# Patient Record
Sex: Female | Born: 1980
Health system: Southern US, Community
[De-identification: ages and names within clinical notes are randomized; demographics above are authoritative.]

## PROBLEM LIST (undated history)

## (undated) DIAGNOSIS — F319 Bipolar disorder, unspecified: Secondary | ICD-10-CM

## (undated) DIAGNOSIS — F32A Depression, unspecified: Secondary | ICD-10-CM

## (undated) DIAGNOSIS — F329 Major depressive disorder, single episode, unspecified: Secondary | ICD-10-CM

## (undated) DIAGNOSIS — Z789 Other specified health status: Secondary | ICD-10-CM

## (undated) HISTORY — PX: NO PAST SURGERIES: SHX2092

---
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
## 1999-04-04 ENCOUNTER — Encounter: Payer: Self-pay | Admitting: Emergency Medicine

## 1999-04-04 ENCOUNTER — Emergency Department (HOSPITAL_COMMUNITY): Admission: EM | Admit: 1999-04-04 | Discharge: 1999-04-04 | Payer: Self-pay | Admitting: Emergency Medicine

## 2000-05-18 ENCOUNTER — Emergency Department (HOSPITAL_COMMUNITY): Admission: EM | Admit: 2000-05-18 | Discharge: 2000-05-18 | Payer: Self-pay | Admitting: *Deleted

## 2000-05-23 ENCOUNTER — Emergency Department (HOSPITAL_COMMUNITY): Admission: EM | Admit: 2000-05-23 | Discharge: 2000-05-23 | Payer: Self-pay | Admitting: Emergency Medicine

## 2000-05-25 ENCOUNTER — Emergency Department (HOSPITAL_COMMUNITY): Admission: EM | Admit: 2000-05-25 | Discharge: 2000-05-25 | Payer: Self-pay

## 2001-01-22 ENCOUNTER — Emergency Department (HOSPITAL_COMMUNITY): Admission: EM | Admit: 2001-01-22 | Discharge: 2001-01-22 | Payer: Self-pay | Admitting: Internal Medicine

## 2001-02-01 ENCOUNTER — Emergency Department (HOSPITAL_COMMUNITY): Admission: EM | Admit: 2001-02-01 | Discharge: 2001-02-01 | Payer: Self-pay | Admitting: Internal Medicine

## 2001-06-12 ENCOUNTER — Emergency Department (HOSPITAL_COMMUNITY): Admission: EM | Admit: 2001-06-12 | Discharge: 2001-06-12 | Payer: Self-pay | Admitting: Emergency Medicine

## 2001-08-18 ENCOUNTER — Emergency Department (HOSPITAL_COMMUNITY): Admission: EM | Admit: 2001-08-18 | Discharge: 2001-08-18 | Payer: Self-pay

## 2002-01-01 ENCOUNTER — Observation Stay (HOSPITAL_COMMUNITY): Admission: AD | Admit: 2002-01-01 | Discharge: 2002-01-01 | Payer: Self-pay | Admitting: Obstetrics and Gynecology

## 2002-01-24 ENCOUNTER — Inpatient Hospital Stay (HOSPITAL_COMMUNITY): Admission: AD | Admit: 2002-01-24 | Discharge: 2002-01-24 | Payer: Self-pay | Admitting: *Deleted

## 2002-01-30 ENCOUNTER — Encounter: Admission: RE | Admit: 2002-01-30 | Discharge: 2002-01-30 | Payer: Self-pay | Admitting: Psychiatry

## 2002-02-02 ENCOUNTER — Ambulatory Visit (HOSPITAL_COMMUNITY): Admission: RE | Admit: 2002-02-02 | Discharge: 2002-02-02 | Payer: Self-pay | Admitting: *Deleted

## 2002-02-16 ENCOUNTER — Encounter: Admission: RE | Admit: 2002-02-16 | Discharge: 2002-02-16 | Payer: Self-pay | Admitting: *Deleted

## 2002-03-19 ENCOUNTER — Ambulatory Visit (HOSPITAL_COMMUNITY): Admission: RE | Admit: 2002-03-19 | Discharge: 2002-03-19 | Payer: Self-pay | Admitting: *Deleted

## 2002-03-31 ENCOUNTER — Inpatient Hospital Stay (HOSPITAL_COMMUNITY): Admission: AD | Admit: 2002-03-31 | Discharge: 2002-03-31 | Payer: Self-pay | Admitting: *Deleted

## 2002-04-09 ENCOUNTER — Ambulatory Visit (HOSPITAL_COMMUNITY): Admission: RE | Admit: 2002-04-09 | Discharge: 2002-04-09 | Payer: Self-pay | Admitting: *Deleted

## 2002-06-08 ENCOUNTER — Inpatient Hospital Stay (HOSPITAL_COMMUNITY): Admission: AD | Admit: 2002-06-08 | Discharge: 2002-06-08 | Payer: Self-pay | Admitting: Obstetrics and Gynecology

## 2002-06-25 ENCOUNTER — Inpatient Hospital Stay (HOSPITAL_COMMUNITY): Admission: AD | Admit: 2002-06-25 | Discharge: 2002-06-25 | Payer: Self-pay | Admitting: *Deleted

## 2002-06-27 ENCOUNTER — Encounter (HOSPITAL_COMMUNITY): Admission: RE | Admit: 2002-06-27 | Discharge: 2002-07-27 | Payer: Self-pay | Admitting: *Deleted

## 2002-06-27 ENCOUNTER — Encounter: Admission: RE | Admit: 2002-06-27 | Discharge: 2002-06-27 | Payer: Self-pay | Admitting: *Deleted

## 2002-07-04 ENCOUNTER — Encounter: Admission: RE | Admit: 2002-07-04 | Discharge: 2002-07-04 | Payer: Self-pay | Admitting: *Deleted

## 2002-07-11 ENCOUNTER — Encounter: Admission: RE | Admit: 2002-07-11 | Discharge: 2002-07-11 | Payer: Self-pay | Admitting: *Deleted

## 2002-07-19 ENCOUNTER — Encounter: Admission: RE | Admit: 2002-07-19 | Discharge: 2002-07-19 | Payer: Self-pay | Admitting: *Deleted

## 2002-07-26 ENCOUNTER — Encounter: Admission: RE | Admit: 2002-07-26 | Discharge: 2002-07-26 | Payer: Self-pay | Admitting: *Deleted

## 2002-07-30 ENCOUNTER — Encounter (HOSPITAL_COMMUNITY): Admission: RE | Admit: 2002-07-30 | Discharge: 2002-08-06 | Payer: Self-pay | Admitting: *Deleted

## 2002-08-02 ENCOUNTER — Encounter: Admission: RE | Admit: 2002-08-02 | Discharge: 2002-08-02 | Payer: Self-pay | Admitting: *Deleted

## 2002-08-09 ENCOUNTER — Encounter: Admission: RE | Admit: 2002-08-09 | Discharge: 2002-08-09 | Payer: Self-pay | Admitting: *Deleted

## 2002-08-09 ENCOUNTER — Inpatient Hospital Stay (HOSPITAL_COMMUNITY): Admission: AD | Admit: 2002-08-09 | Discharge: 2002-08-12 | Payer: Self-pay | Admitting: *Deleted

## 2002-09-18 ENCOUNTER — Emergency Department (HOSPITAL_COMMUNITY): Admission: EM | Admit: 2002-09-18 | Discharge: 2002-09-18 | Payer: Self-pay | Admitting: Emergency Medicine

## 2002-10-03 ENCOUNTER — Encounter (HOSPITAL_BASED_OUTPATIENT_CLINIC_OR_DEPARTMENT_OTHER): Payer: Self-pay | Admitting: General Surgery

## 2002-10-03 ENCOUNTER — Ambulatory Visit (HOSPITAL_COMMUNITY): Admission: RE | Admit: 2002-10-03 | Discharge: 2002-10-03 | Payer: Self-pay | Admitting: General Surgery

## 2002-12-18 ENCOUNTER — Encounter: Admission: RE | Admit: 2002-12-18 | Discharge: 2002-12-18 | Payer: Self-pay | Admitting: Sports Medicine

## 2003-01-29 ENCOUNTER — Encounter: Admission: RE | Admit: 2003-01-29 | Discharge: 2003-01-29 | Payer: Self-pay | Admitting: Sports Medicine

## 2003-04-22 ENCOUNTER — Encounter: Admission: RE | Admit: 2003-04-22 | Discharge: 2003-04-22 | Payer: Self-pay | Admitting: Sports Medicine

## 2003-04-22 ENCOUNTER — Other Ambulatory Visit: Admission: RE | Admit: 2003-04-22 | Discharge: 2003-04-22 | Payer: Self-pay | Admitting: Family Medicine

## 2003-04-30 ENCOUNTER — Encounter: Admission: RE | Admit: 2003-04-30 | Discharge: 2003-04-30 | Payer: Self-pay | Admitting: Sports Medicine

## 2003-04-30 ENCOUNTER — Encounter: Payer: Self-pay | Admitting: Sports Medicine

## 2003-04-30 ENCOUNTER — Encounter: Admission: RE | Admit: 2003-04-30 | Discharge: 2003-04-30 | Payer: Self-pay | Admitting: Family Medicine

## 2003-05-01 ENCOUNTER — Encounter: Admission: RE | Admit: 2003-05-01 | Discharge: 2003-05-01 | Payer: Self-pay | Admitting: Family Medicine

## 2003-05-05 ENCOUNTER — Emergency Department (HOSPITAL_COMMUNITY): Admission: EM | Admit: 2003-05-05 | Discharge: 2003-05-05 | Payer: Self-pay | Admitting: *Deleted

## 2003-05-06 ENCOUNTER — Encounter: Payer: Self-pay | Admitting: Emergency Medicine

## 2003-05-06 ENCOUNTER — Emergency Department (HOSPITAL_COMMUNITY): Admission: EM | Admit: 2003-05-06 | Discharge: 2003-05-06 | Payer: Self-pay | Admitting: *Deleted

## 2003-05-08 ENCOUNTER — Encounter: Admission: RE | Admit: 2003-05-08 | Discharge: 2003-05-08 | Payer: Self-pay | Admitting: Family Medicine

## 2003-05-13 ENCOUNTER — Encounter: Admission: RE | Admit: 2003-05-13 | Discharge: 2003-05-13 | Payer: Self-pay | Admitting: Sports Medicine

## 2003-05-13 ENCOUNTER — Encounter: Payer: Self-pay | Admitting: Sports Medicine

## 2003-05-15 ENCOUNTER — Encounter: Admission: RE | Admit: 2003-05-15 | Discharge: 2003-05-15 | Payer: Self-pay | Admitting: Family Medicine

## 2003-05-21 ENCOUNTER — Encounter: Admission: RE | Admit: 2003-05-21 | Discharge: 2003-05-21 | Payer: Self-pay | Admitting: Family Medicine

## 2003-06-04 ENCOUNTER — Encounter: Admission: RE | Admit: 2003-06-04 | Discharge: 2003-06-04 | Payer: Self-pay | Admitting: Family Medicine

## 2003-08-09 ENCOUNTER — Emergency Department (HOSPITAL_COMMUNITY): Admission: EM | Admit: 2003-08-09 | Discharge: 2003-08-09 | Payer: Self-pay | Admitting: Emergency Medicine

## 2004-01-07 ENCOUNTER — Emergency Department (HOSPITAL_COMMUNITY): Admission: EM | Admit: 2004-01-07 | Discharge: 2004-01-07 | Payer: Self-pay | Admitting: Emergency Medicine

## 2004-01-15 ENCOUNTER — Emergency Department (HOSPITAL_COMMUNITY): Admission: EM | Admit: 2004-01-15 | Discharge: 2004-01-15 | Payer: Self-pay | Admitting: Emergency Medicine

## 2004-01-16 ENCOUNTER — Encounter: Admission: RE | Admit: 2004-01-16 | Discharge: 2004-01-16 | Payer: Self-pay | Admitting: Family Medicine

## 2004-01-16 ENCOUNTER — Other Ambulatory Visit: Admission: RE | Admit: 2004-01-16 | Discharge: 2004-01-16 | Payer: Self-pay | Admitting: Family Medicine

## 2004-07-23 ENCOUNTER — Emergency Department (HOSPITAL_COMMUNITY): Admission: EM | Admit: 2004-07-23 | Discharge: 2004-07-23 | Payer: Self-pay | Admitting: Emergency Medicine

## 2004-10-15 ENCOUNTER — Ambulatory Visit: Payer: Self-pay | Admitting: Sports Medicine

## 2005-08-10 IMAGING — CT CT PELVIS W/O CM
1 series · 16 of 32 positions shown, 20 images · non-contrast
Comparison: none

CLINICAL DATA: Abdominal and pelvic pain.  Right flank pain. 
 CT ABDOMEN AND PELVIS WITHOUT CONTRAST
TECHNIQUE: Multidetector helical CT scanning obtained through the abdomen and pelvis.

[Series 2: abd pelvis · axial · 0.73mm/px · z∈[-389,-24]mm · 16 of 82 slices shown, 20 images]
[im 6/82  soft-tissue]
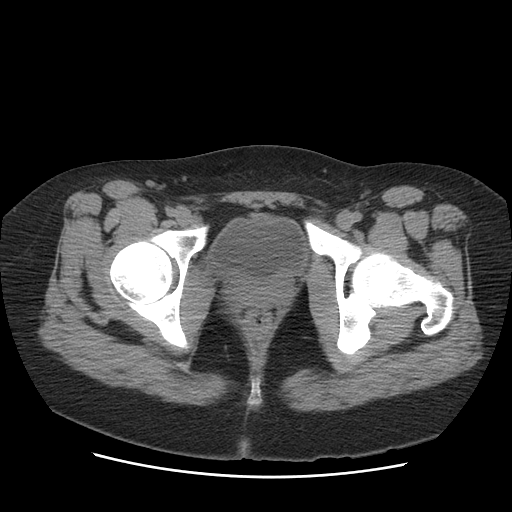
[im 6/82  bone]
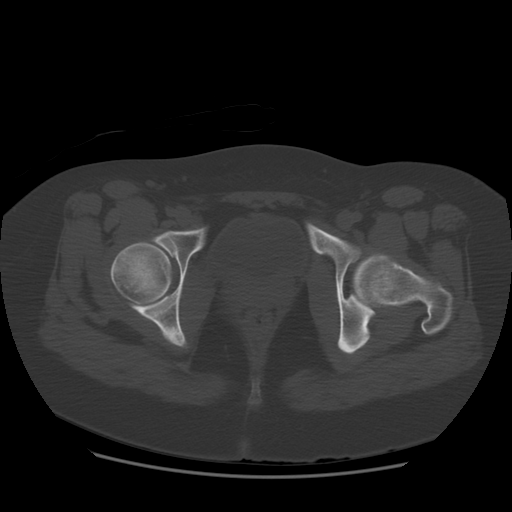
[im 11/82  soft-tissue]
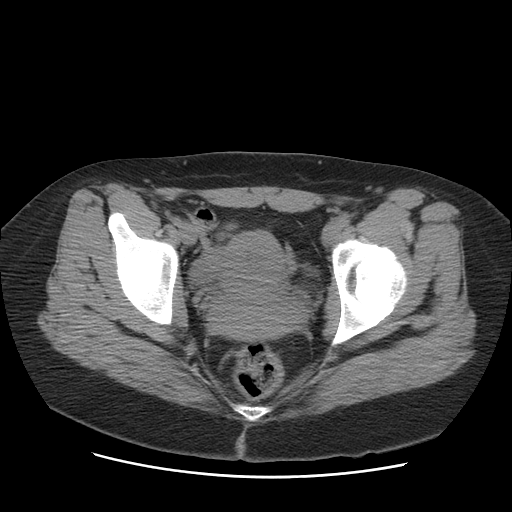
[im 16/82  soft-tissue]
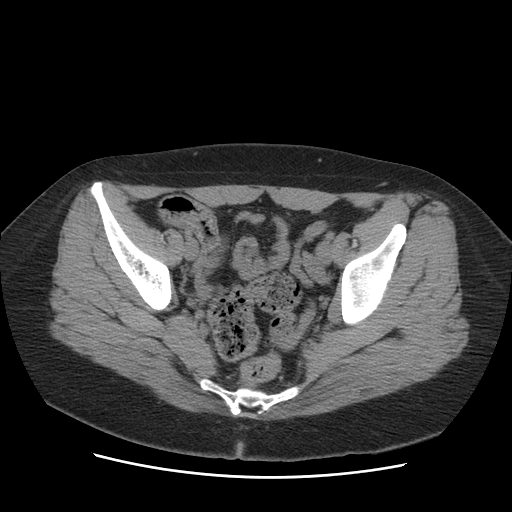
[im 21/82  soft-tissue]
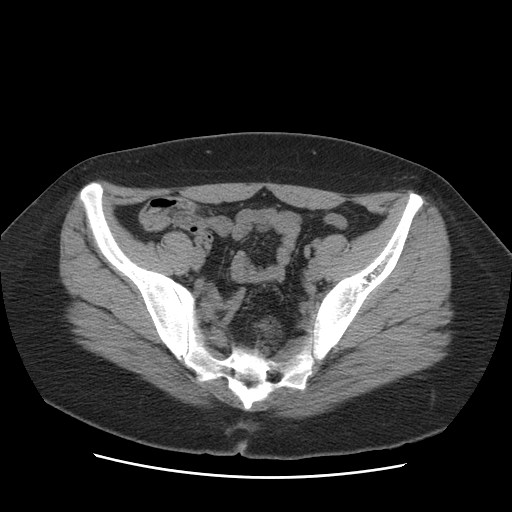
[im 27/82  soft-tissue]
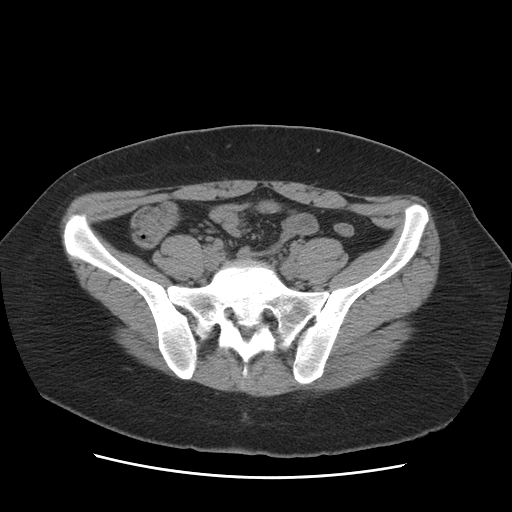
[im 32/82  soft-tissue]
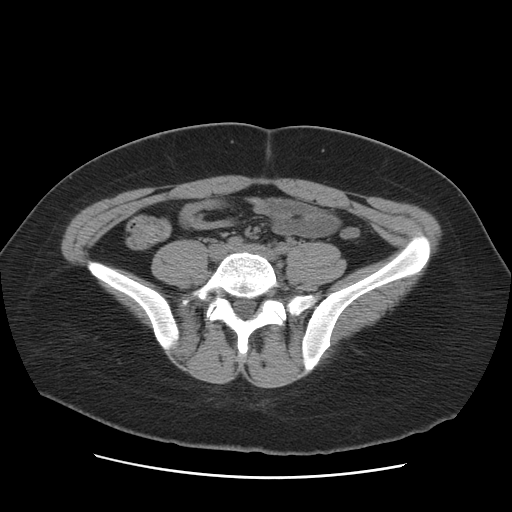
[im 37/82  soft-tissue]
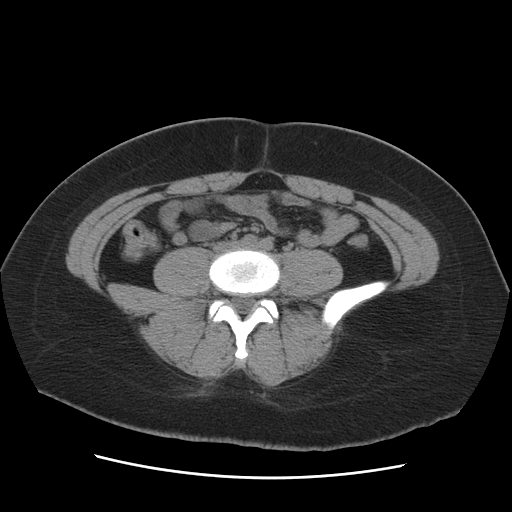
[im 45/82  soft-tissue]
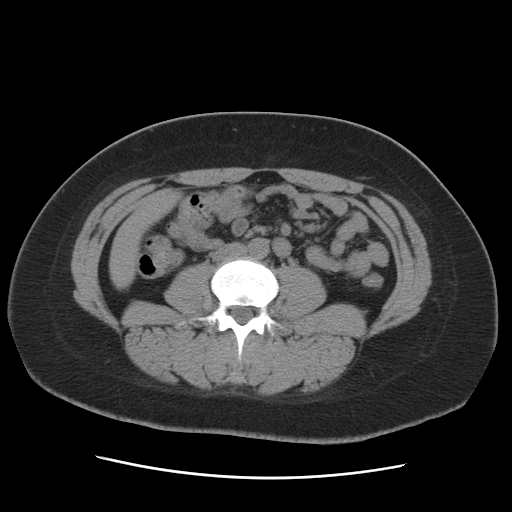
[im 50/82  soft-tissue]
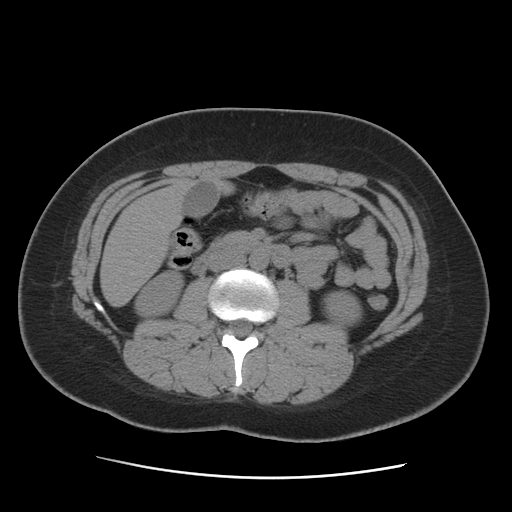
[im 50/82  bone]
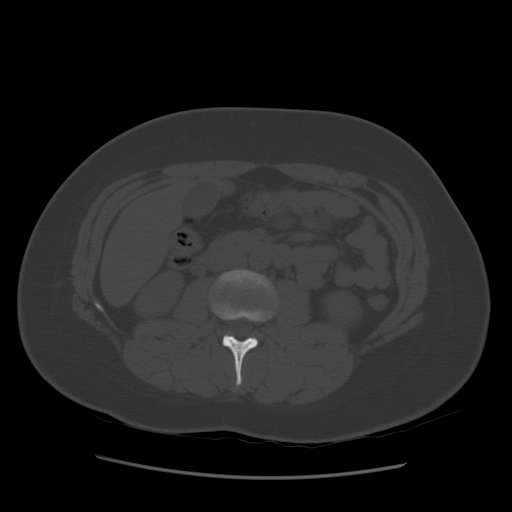
[im 55/82  soft-tissue]
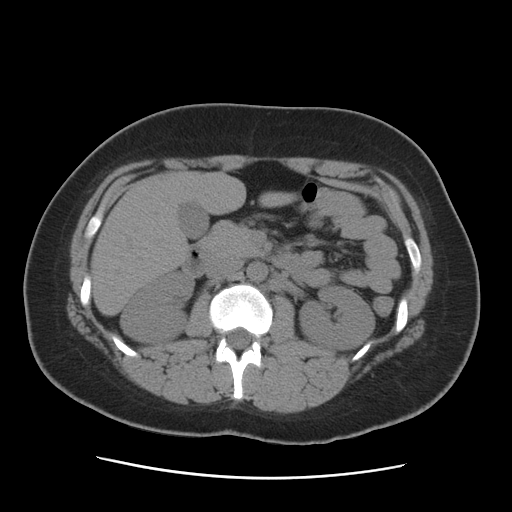
[im 61/82  soft-tissue]
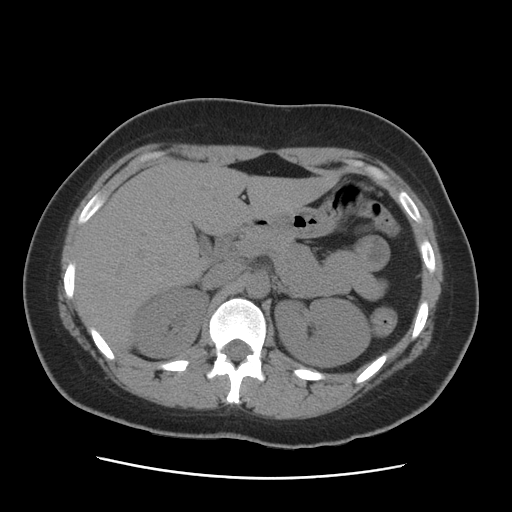
[im 66/82  soft-tissue]
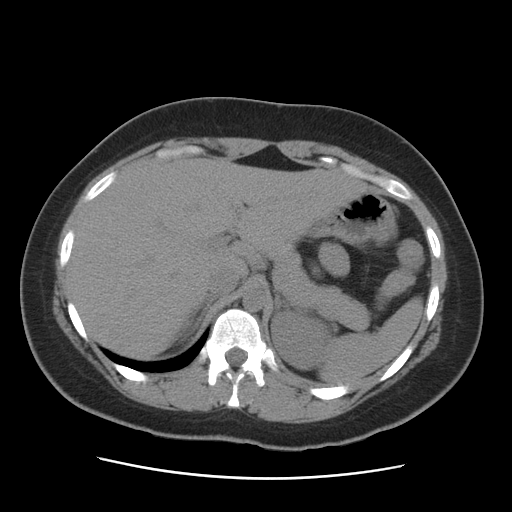
[im 71/82  soft-tissue]
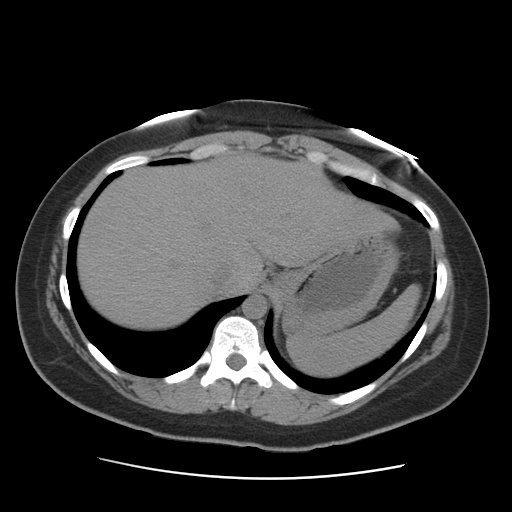
[im 71/82  lung]
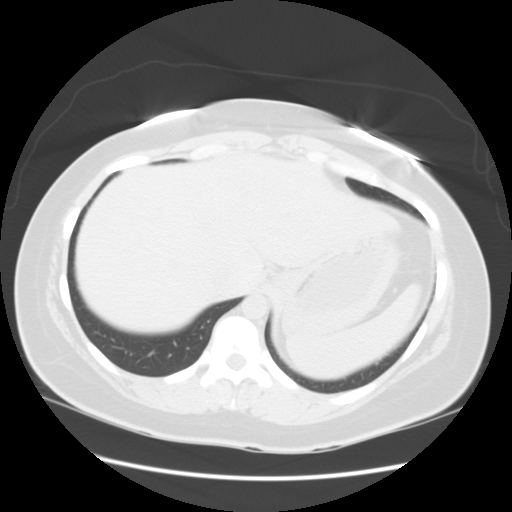
[im 74/82  lung]
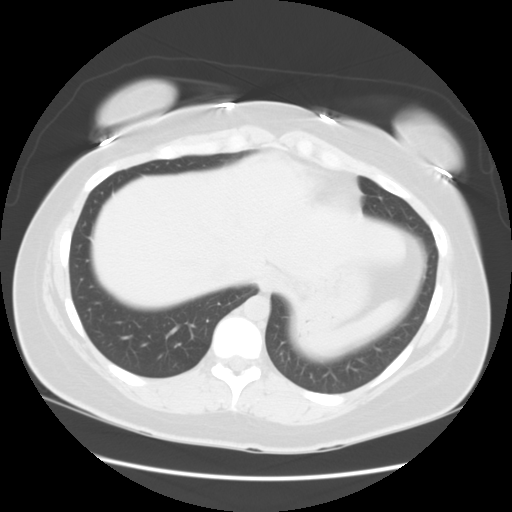
[im 76/82  soft-tissue]
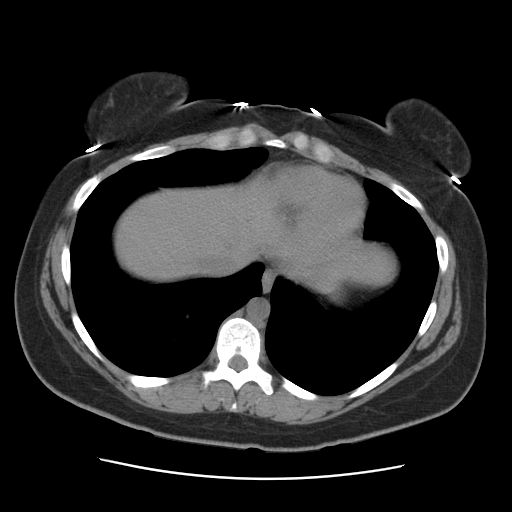
[im 76/82  lung]
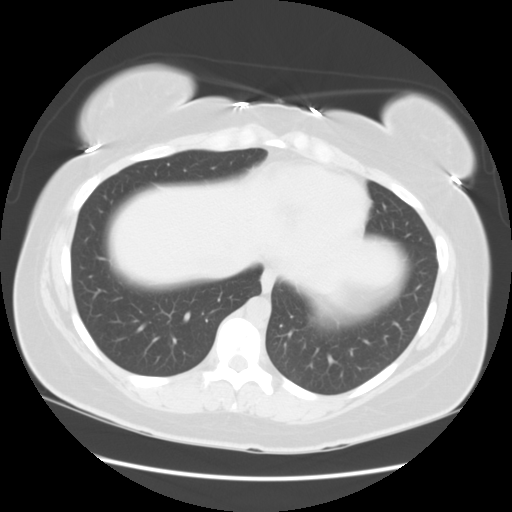
[im 79/82  lung]
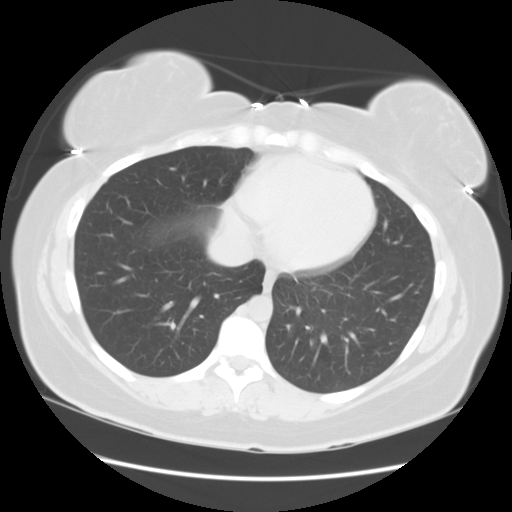

[16 of 32 positions shown; findings below may reference images not displayed]

FINDINGS: No comparison films available. 
 CT ABDOMEN 
 The liver, gallbladder, spleen, pancreas, adrenal glands, and kidneys are unremarkable.  Please note that parenchymal lesions may be missed within the solid viscera as intravenous contrast was not administered.  No evidence of enlarged lymph nodes, free fluid, or abdominal aortic aneurysm.  Visualized bowel is grossly unremarkable. 
 IMPRESSION
 No acute abnormality. 
 CT PELVIS 
 The bladder, bowel and appendix are unremarkable.  No free fluid, enlarged lymph nodes or urinary calculi.  
 IMPRESSION
 No acute abnormality.

## 2005-10-23 ENCOUNTER — Emergency Department (HOSPITAL_COMMUNITY): Admission: EM | Admit: 2005-10-23 | Discharge: 2005-10-23 | Payer: Self-pay | Admitting: Emergency Medicine

## 2005-10-26 ENCOUNTER — Inpatient Hospital Stay (HOSPITAL_COMMUNITY): Admission: AD | Admit: 2005-10-26 | Discharge: 2005-10-26 | Payer: Self-pay | Admitting: Family Medicine

## 2006-02-18 ENCOUNTER — Encounter (INDEPENDENT_AMBULATORY_CARE_PROVIDER_SITE_OTHER): Payer: Self-pay | Admitting: *Deleted

## 2006-03-11 ENCOUNTER — Ambulatory Visit: Payer: Self-pay | Admitting: Family Medicine

## 2006-03-11 ENCOUNTER — Other Ambulatory Visit: Admission: RE | Admit: 2006-03-11 | Discharge: 2006-03-11 | Payer: Self-pay | Admitting: Family Medicine

## 2006-03-17 ENCOUNTER — Ambulatory Visit: Payer: Self-pay | Admitting: Family Medicine

## 2006-04-05 ENCOUNTER — Ambulatory Visit: Payer: Self-pay | Admitting: Family Medicine

## 2006-11-17 DIAGNOSIS — F319 Bipolar disorder, unspecified: Secondary | ICD-10-CM | POA: Insufficient documentation

## 2006-11-17 DIAGNOSIS — E282 Polycystic ovarian syndrome: Secondary | ICD-10-CM

## 2006-11-17 DIAGNOSIS — E669 Obesity, unspecified: Secondary | ICD-10-CM

## 2006-11-17 DIAGNOSIS — J45909 Unspecified asthma, uncomplicated: Secondary | ICD-10-CM | POA: Insufficient documentation

## 2006-11-17 DIAGNOSIS — F17200 Nicotine dependence, unspecified, uncomplicated: Secondary | ICD-10-CM

## 2006-11-17 DIAGNOSIS — L680 Hirsutism: Secondary | ICD-10-CM

## 2006-11-18 ENCOUNTER — Encounter (INDEPENDENT_AMBULATORY_CARE_PROVIDER_SITE_OTHER): Payer: Self-pay | Admitting: *Deleted

## 2007-05-19 IMAGING — US US OB COMP LESS 14 WK
1 series · 14 of 27 positions shown · non-contrast
Comparison: none

CLINICAL DATA: 24-year-old female with positive pregnancy test.  Pelvic pain.  Nausea and vomiting.  Estimated gestational age by LMP of 7 weeks 6 days.  
 OBSTETRICAL ULTRASOUND <14 WKS AND TRANSVAGINAL OB US:
TECHNIQUE: Both transabdominal and transvaginal ultrasound examinations were performed for complete evaluation of the gestation as well as the maternal uterus, adnexal regions, and pelvic cul-de-sac.

[Series 1: ob · 0.32mm/px · 14 of 27 slices shown]
[im 1/27]
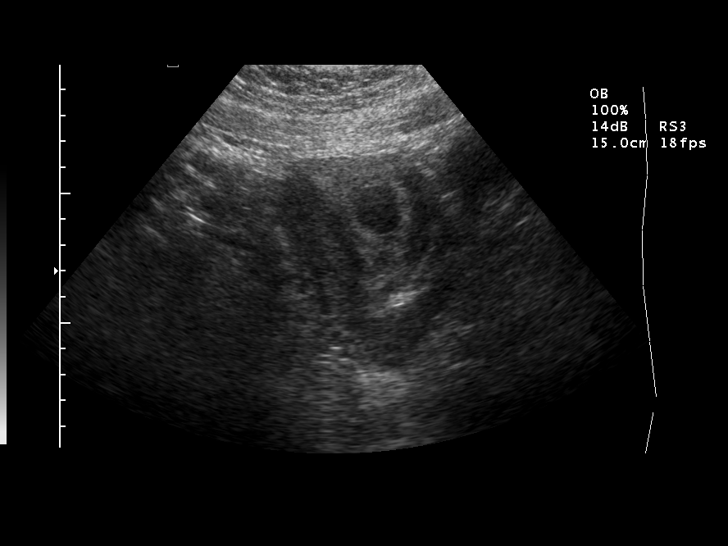
[im 3/27]
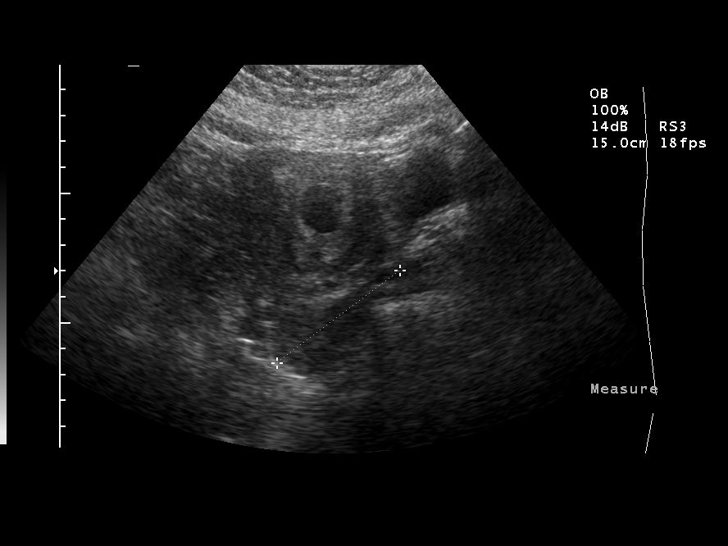
[im 5/27]
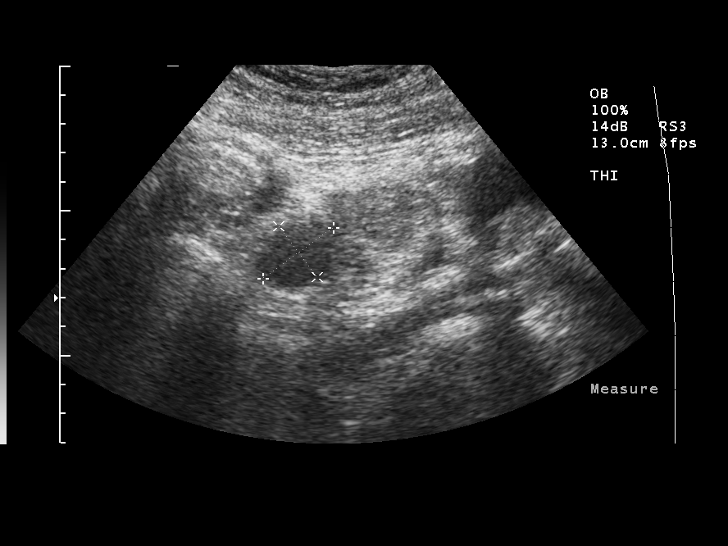
[im 7/27]
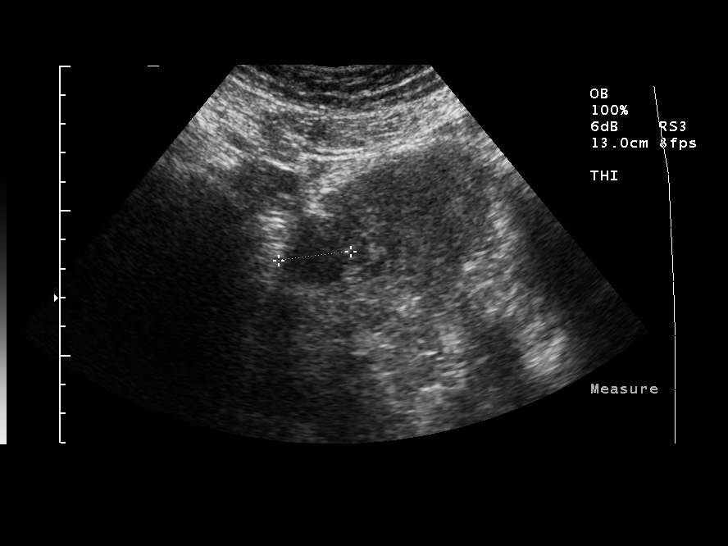
[im 9/27]
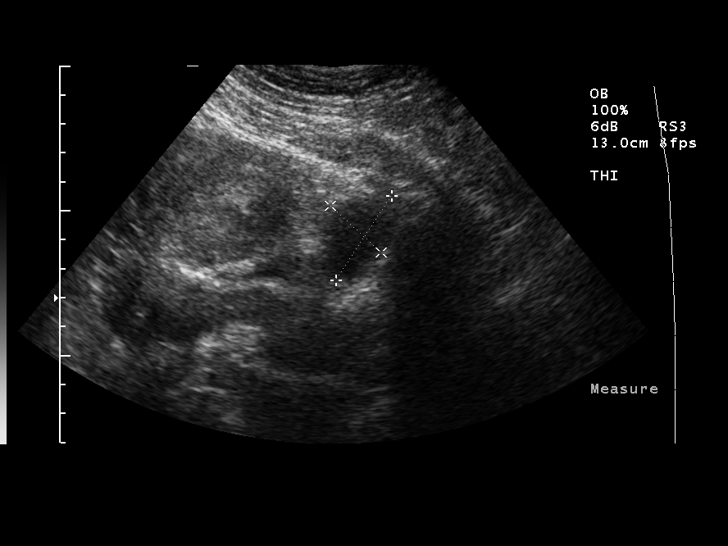
[im 11/27]
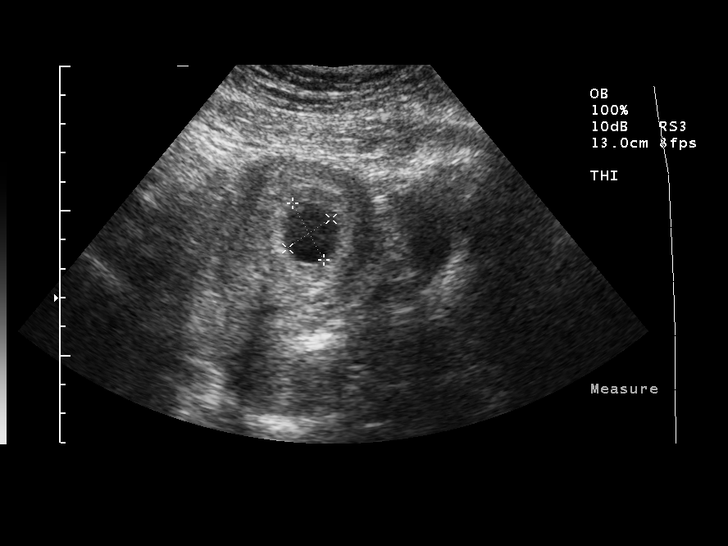
[im 13/27]
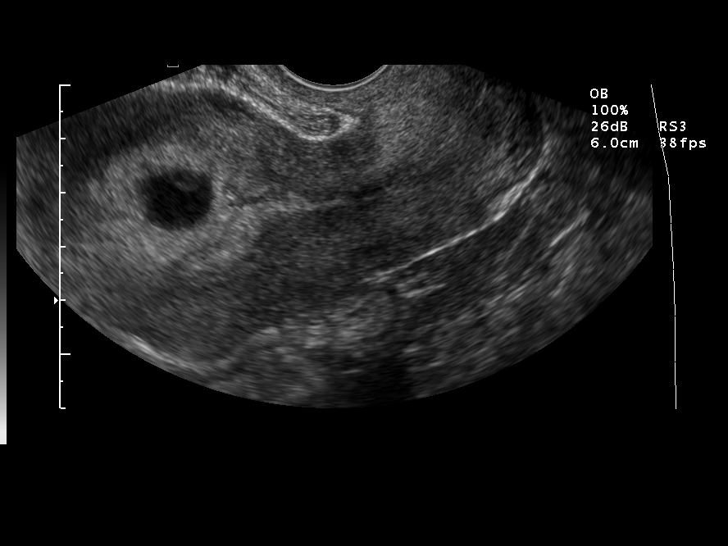
[im 15/27]
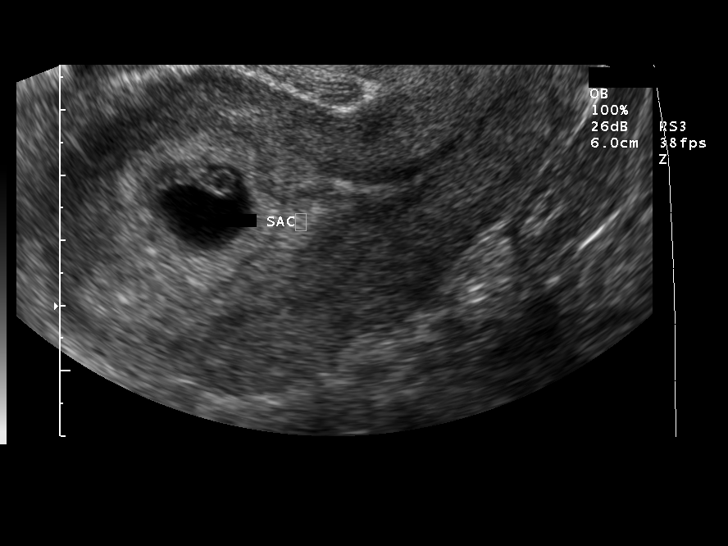
[im 17/27]
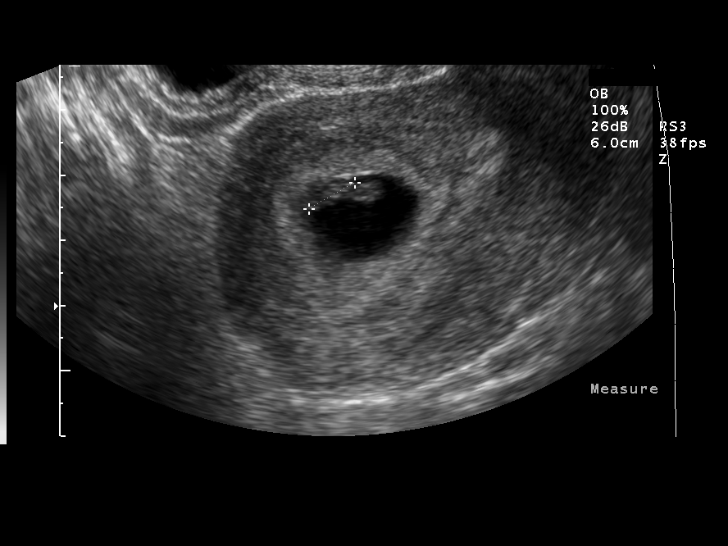
[im 19/27]
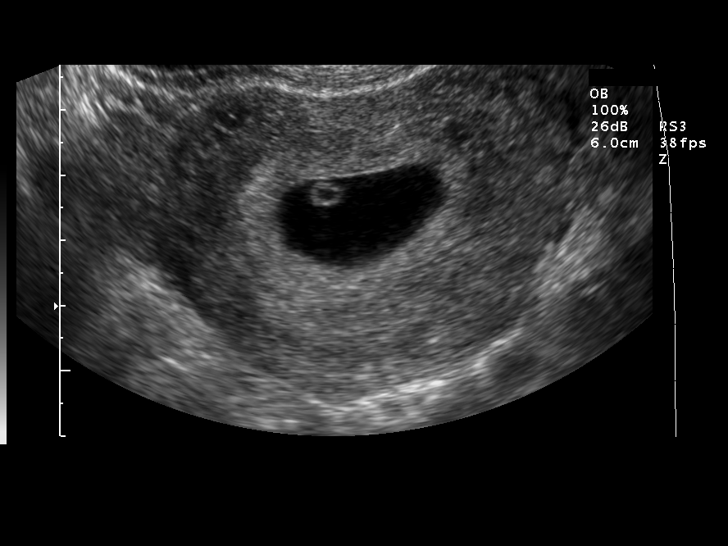
[im 21/27]
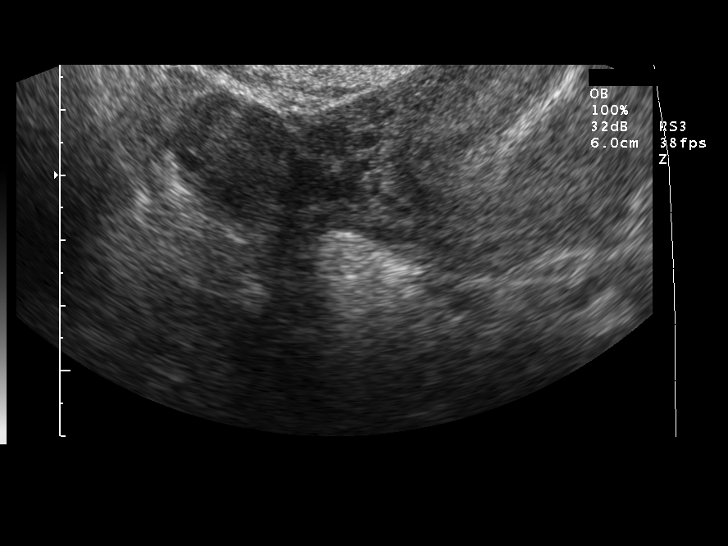
[im 23/27]
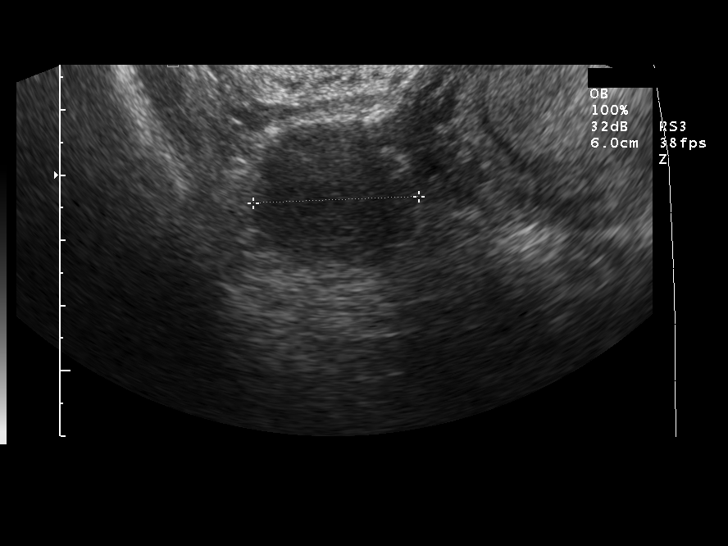
[im 25/27]
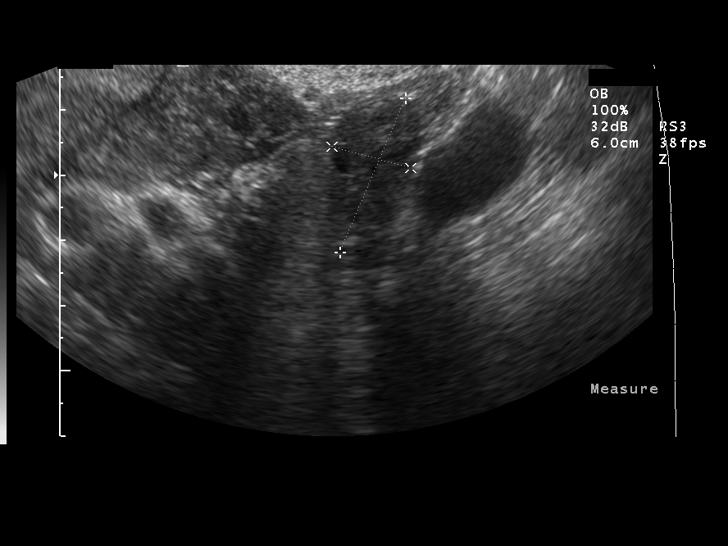
[im 27/27]
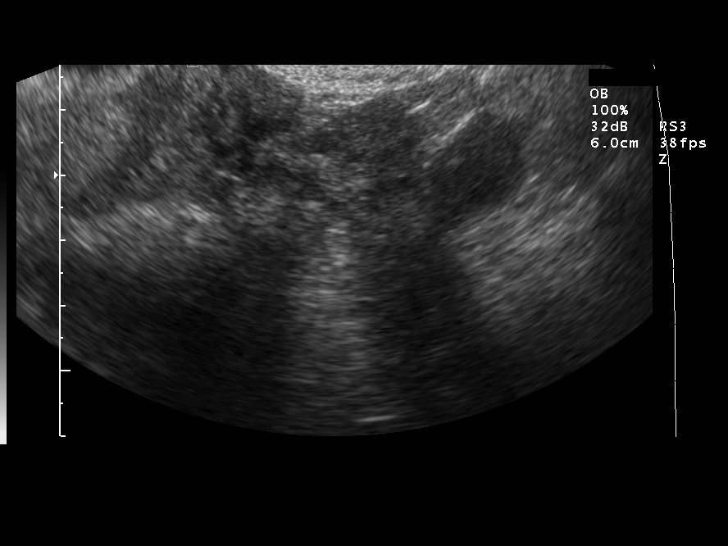

[14 of 27 positions shown; findings below may reference images not displayed]

FINDINGS: A single intrauterine gestational sac with yolk sac and fetal pole are identified.  Crown rump length measures 7.7 mm corresponding to a gestational age of 6 weeks 5 days.  Fetal heart rate measures 151 bpm.  There is no evidence of subchorionic hemorrhage.  
 The ovaries bilaterally are unremarkable.  No free fluid.
IMPRESSION: 1.  Single living intrauterine gestation with estimated gestational age of 6 weeks 5 days.  
 2.  No acute abnormality.

## 2015-10-13 ENCOUNTER — Encounter (HOSPITAL_COMMUNITY): Payer: Self-pay | Admitting: Emergency Medicine

## 2015-10-13 ENCOUNTER — Ambulatory Visit (HOSPITAL_COMMUNITY)
Admission: RE | Admit: 2015-10-13 | Discharge: 2015-10-13 | Disposition: A | Discharge status: Home / Self Care | Payer: PRIVATE HEALTH INSURANCE | Attending: Psychiatry | Admitting: Psychiatry

## 2015-10-13 DIAGNOSIS — F17210 Nicotine dependence, cigarettes, uncomplicated: Secondary | ICD-10-CM | POA: Diagnosis not present

## 2015-10-13 DIAGNOSIS — F331 Major depressive disorder, recurrent, moderate: Secondary | ICD-10-CM | POA: Diagnosis present

## 2015-10-13 DIAGNOSIS — F319 Bipolar disorder, unspecified: Secondary | ICD-10-CM | POA: Diagnosis not present

## 2015-10-13 HISTORY — DX: Bipolar disorder, unspecified: F31.9

## 2015-10-13 HISTORY — DX: Depression, unspecified: F32.A

## 2015-10-13 HISTORY — DX: Major depressive disorder, single episode, unspecified: F32.9

## 2015-10-13 HISTORY — DX: Other specified health status: Z78.9

## 2015-10-13 NOTE — BH Assessment (Addendum)
Assessment Note  Virginia Ballard is an 35 y.o. female who voluntarily presented to [facility] as a walk-in with c/o depression. Pt was very tearful throughout the assessment. Pt reported having a hx of bipolar and depression and being prescribed medications, at one point, to deal with her symptoms. Pt shared that she hasn't taken meds in @ 2 years. Pt indicated that, for the past 2 weeks, she's noticed that it's been increasingly difficult to properly function at work, due to her changing emotions and inability to focus, due to racing thoughts. Pt denied SI/HI/AVH, but indicated that she wanted to get back on her medications, along with therapy, so that she could get back on track at work before her symptoms worsen. Pt denied drug/alcohol abuse. Pt was amenable to trying Intensive Outpatient and an appt was scheduled.   Diagnosis: Major Depressive Disorder, recurrent episode, moderate, provisional  Past Medical History:  Past Medical History  Diagnosis Date  . Medical history non-contributory   . Bipolar disorder (HCC)     diagnosed >2 yrs ago  . Depression     diagnosed >2 yrs ago    Past Surgical History  Procedure Laterality Date  . No past surgeries      Family History: History reviewed. No pertinent family history.  Social History:  reports that she has been smoking Cigarettes.  She has been smoking about 0.50 packs per day. She does not have any smokeless tobacco history on file. She reports that she does not drink alcohol or use illicit drugs.  Additional Social History:  Alcohol / Drug Use Pain Medications: pt denies Prescriptions: pt denies Over the Counter: pt denies History of alcohol / drug use?: No history of alcohol / drug abuse  CIWA:   COWS:    Allergies: Allergies no known allergies  Home Medications:  (Not in a hospital admission)  OB/GYN Status:  No LMP recorded (lmp unknown).  General Assessment Data Location of Assessment: [facility] Assessment Services  (walk-in) TTS Assessment: In system Is this a Tele or Face-to-Face Assessment?: Face-to-Face Is this an Initial Assessment or a Re-assessment for this encounter?: Initial Assessment Marital status: Single Is patient pregnant?: No Pregnancy Status: No Living Arrangements: Spouse/significant other Can pt return to current living arrangement?: Yes Admission Status: Voluntary Is patient capable of signing voluntary admission?: Yes Referral Source: Self/Family/Friend Insurance type: Tour manager Exam [facility] Walk-in ONLY) Medical Exam completed: No Reason for MSE not completed: Patient Refused  Crisis Care Plan Living Arrangements: Spouse/significant other Name of Psychiatrist: none Name of Therapist: none  Education Status Is patient currently in school?: No  Risk to self with the past 6 months Suicidal Ideation: No Has patient been a risk to self within the past 6 months prior to admission? : No Suicidal Intent: No Has patient had any suicidal intent within the past 6 months prior to admission? : No Is patient at risk for suicide?: No Suicidal Plan?: No Has patient had any suicidal plan within the past 6 months prior to admission? : No Access to Means: No What has been your use of drugs/alcohol within the last 12 months?: pt denies Previous Attempts/Gestures: Yes How many times?: 2 Other Self Harm Risks: in past Triggers for Past Attempts: Other (Comment) (death of grandparents) Intentional Self Injurious Behavior: Cutting (hasn't self injured in over 5 years) Comment - Self Injurious Behavior: hx of cutting and scratching > 5 yrs ago Family Suicide History: Unknown Recent stressful life event(s): Other (Comment) (overwhelming thoughts)  Persecutory voices/beliefs?: No Depression: Yes Depression Symptoms: Tearfulness, Isolating, Feeling angry/irritable Substance abuse history and/or treatment for substance abuse?: No Suicide prevention information  given to non-admitted patients: Yes  Risk to Others within the past 6 months Homicidal Ideation: No Does patient have any lifetime risk of violence toward others beyond the six months prior to admission? : No Thoughts of Harm to Others: No Current Homicidal Intent: No Current Homicidal Plan: No Access to Homicidal Means: No History of harm to others?: No Assessment of Violence: None Noted Does patient have access to weapons?: No Criminal Charges Pending?: No Does patient have a court date: No Is patient on probation?: No  Psychosis Hallucinations: None noted Delusions: None noted  Mental Status Report Appearance/Hygiene: Unremarkable Eye Contact: Good Motor Activity: Unremarkable Speech: Logical/coherent Level of Consciousness: Alert Mood: Sad Affect: Sad, Appropriate to circumstance Anxiety Level: Minimal Thought Processes: Coherent, Relevant Judgement: Unimpaired Orientation: Person, Place, Time, Situation, Appropriate for developmental age Obsessive Compulsive Thoughts/Behaviors: None  Cognitive Functioning Concentration: Normal Memory: Recent Intact, Remote Intact IQ: Average Insight: Good Impulse Control: Good Appetite: Good Sleep: No Change Vegetative Symptoms: None     Prior Inpatient Therapy Prior Inpatient Therapy: Yes Prior Therapy Dates: 2008 & 2013 Prior Therapy Facilty/Provider(s): facility in [place], [place] Reason for Treatment: depression  Prior Outpatient Therapy Prior Outpatient Therapy: Yes Prior Therapy Dates: 2015 Prior Therapy Facilty/Provider(s): provider in [place], [place] Reason for Treatment: bipolar; depression Does patient have an ACCT team?: No Does patient have Intensive In-House Services?  : No Does patient have [facility] services? : No Does patient have P4CC services?: No  ADL Screening (condition at time of admission) Is the patient deaf or have difficulty hearing?: No Does the patient have difficulty seeing, even  when wearing glasses/contacts?: No Does the patient have difficulty concentrating, remembering, or making decisions?: Yes Does the patient have difficulty dressing or bathing?: No Does the patient have difficulty walking or climbing stairs?: No Weakness of Legs: None Weakness of Arms/Hands: None  Home Assistive Devices/Equipment Home Assistive Devices/Equipment: None  Therapy Consults (therapy consults require a physician order) PT Evaluation Needed: No OT Evalulation Needed: No SLP Evaluation Needed: No Abuse/Neglect Assessment (Assessment to be complete while patient is alone) Physical Abuse: Denies Verbal Abuse: Denies Sexual Abuse: Denies Exploitation of patient/patient's resources: Denies Self-Neglect: Denies Values / Beliefs Cultural Requests During Hospitalization: None Spiritual Requests During Hospitalization: None Consults Spiritual Care Consult Needed: No Social Work Consult Needed: No Merchant navy officer (For Healthcare) Does patient have an advance directive?: No Would patient like information on creating an advanced directive?: No - patient declined information    Additional Information 1:1 In Past 12 Months?: No CIRT Risk: No Elopement Risk: No Does patient have medical clearance?: No     Disposition:  Disposition Initial Assessment Completed for this Encounter: Yes Disposition of Patient: Outpatient treatment (per Fransisca Kaufmann, NP) Type of outpatient treatment: Psych Intensive Outpatient (pt has appt with [facility] IOP on 10/21/15 at 0845am)  On Site Evaluation by:   Reviewed with Physician:    Laddie Aquas 10/13/2015 1:04 PM

## 2015-10-22 ENCOUNTER — Ambulatory Visit (HOSPITAL_COMMUNITY): Payer: PRIVATE HEALTH INSURANCE | Admitting: Psychiatry

## 2015-10-28 ENCOUNTER — Encounter (HOSPITAL_COMMUNITY): Payer: Self-pay | Admitting: Psychiatry

## 2015-10-28 ENCOUNTER — Other Ambulatory Visit (HOSPITAL_COMMUNITY): Payer: PRIVATE HEALTH INSURANCE | Attending: Psychiatry | Admitting: Psychiatry

## 2015-10-28 DIAGNOSIS — F419 Anxiety disorder, unspecified: Secondary | ICD-10-CM | POA: Insufficient documentation

## 2015-10-28 DIAGNOSIS — F17210 Nicotine dependence, cigarettes, uncomplicated: Secondary | ICD-10-CM | POA: Diagnosis not present

## 2015-10-28 DIAGNOSIS — F331 Major depressive disorder, recurrent, moderate: Secondary | ICD-10-CM | POA: Diagnosis not present

## 2015-10-28 DIAGNOSIS — F900 Attention-deficit hyperactivity disorder, predominantly inattentive type: Secondary | ICD-10-CM | POA: Insufficient documentation

## 2015-10-28 DIAGNOSIS — F1210 Cannabis abuse, uncomplicated: Secondary | ICD-10-CM | POA: Diagnosis not present

## 2015-10-28 NOTE — Progress Notes (Signed)
Comprehensive Clinical Assessment (CCA) Note  10/28/2015 Virginia Ballard [id number]  Visit Diagnosis:      ICD-9-CM ICD-10-CM   1. Major depressive disorder, recurrent episode, moderate (HCC) 296.32 F33.1   2. Marijuana abuse 305.20 F12.10   3. Depression, major, recurrent, moderate (HCC) 296.32 F33.1       CCA Part One  Part One has been completed on paper by the patient.  (See scanned document in Chart Review)  CCA Part Two A  Intake/Chief Complaint:  CCA Intake With Chief Complaint CCA Part Two Date: 10/28/15 CCA Part Two Time: 1414 Chief Complaint/Presenting Problem: This is a 35 yr. old, single, employed [place] American female, who was referred per Boneta Lucks, PhD; treatment for ongoing depressive and anxiety symptoms.  Denies SI/HI or A/V hallucinations.  Pt relocated from [place] in August 2016; but states the symptoms started to worsen in January 2017.   Triggers/Stressors:  1)   Unresolved grief/loss issues:  Maternal Grandparents to whom pt was very close to.  Grandfather died in Feb 06, 2007 and Grandmother in 02-06-12.  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  Pt states due to the depression, she has missed a lot of days.  Reports that she has been written up.  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  Pt works twelve hour shifts on Saturday, Sunday and Monday.  Pt admits to two prior psychiatric hospitalizations (one for depression and the other suicide attempt.)  Pt admits to hx of self injurious behaviors (cutting wrists) yrs ago.  Family Hx:  Father (ETOH)                                                                                                                      Patients Currently Reported Symptoms/Problems: isolation, anxiety, tearfulness Collateral Involvement: Sister in [place], [place] is very supportive. Individual's Strengths: Pt is motivated for treatment.  Mental Health  Symptoms Depression:     Mania:     Anxiety:      Psychosis:     Trauma:     Obsessions:     Compulsions:     Inattention:     Hyperactivity/Impulsivity:     Oppositional/Defiant Behaviors:     Borderline Personality:     Other Mood/Personality Symptoms:      Mental Status Exam Appearance and self-care  Stature:     Weight:     Clothing:     Grooming:     Cosmetic use:     Posture/gait:     Motor activity:     Sensorium  Attention:     Concentration:     Orientation:     Recall/memory:     Affect and Mood  Affect:     Mood:     Relating  Eye contact:     Facial expression:     Attitude toward examiner:     Thought and  Language  Speech flow:    Thought content:  Thought Content: Appropriate to mood and circumstances  Preoccupation:     Hallucinations:     Organization:     Company secretary of Knowledge:     Intelligence:     Abstraction:     Judgement:     Dance movement psychotherapist:     Insight:     Decision Making:     Social Functioning  Social Maturity:     Social Judgement:     Stress  Stressors:     Coping Ability:     Skill Deficits:     Supports:      Family and Psychosocial History: Family history Are you sexually active?: Yes (Has supportive boyfriend of one yr.) How many children?: 1 (19 yr old daughter, who resides with pt's parents in [place].)  Childhood History:  Childhood History Additional childhood history information: Pt was born in [place], [place].  Father was in the [employer] Garment/textile technologist).  He was stationed in [place], before the family moved to [place].  Pt was very close to her father.  "I held him on a pedestal, he couldn't do any wrong in my eyes.  I wanted to be with him all the time."  According to pt, her childhood was very chaotic.  "My parents argued all the time.  Later on I was told by my sister that our father was having a lot of affairs."  Pt states that news hit her very hard, "but it explained all the arguing."  Pt states that school  was her safe place.  "I could get away from all the bickering at home."  Pt reports she was a shy, non-trusting, low self-esteem student, who would sit only in the back of the class.  Reports difficulty focusing in school.                                                                               Description of patient's relationship with caregiver when they were a child: Pt was very close to father. Patient's description of current relationship with people who raised him/her: Still closest to father as opposed to mother. 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 Has patient ever been in the [employer]?: No  Education: Education Did You Have Any Difficulty At [employer]?:  (States although it was a safe place; she had difficulty focusing.)  Religion: Religion/Spirituality How Might This Affect Treatment?: n/a  Leisure/Recreation: Leisure / Recreation Leisure and Hobbies: being at home watching tv  Exercise/Diet: Exercise/Diet Do You Exercise?: No  CCA Part Two C  Alcohol/Drug Use: Alcohol / Drug Use History of alcohol / drug use?: Yes (THC) Negative Consequences of Use: Work / School,Programmer, multimediarsonal relationships                      CCA Part Three  ASAM's:  Six Dimensions of Multidimensional Assessment  Dimension 1:  Acute Intoxication and/or Withdrawal Potential:      Dimension 2:  Biomedical Conditions and Complications:     Dimension 3:  Emotional, Behavioral, or Cognitive Conditions and Complications:     Dimension 4:  Readiness to Change:     Dimension 5:  Relapse, Continued use, or Continued Problem Potential:     Dimension 6:  Recovery/Living Environment:      Substance use Disorder (SUD)    Social Function:     Stress:     Risk Assessment- Self-Harm Potential:    Risk Assessment -Dangerous to Others Potential: Risk Assessment For Dangerous to Others Potential Intent: Vague intent or NA (n/a) Notification Required: No need or identified person  DSM5 Diagnoses: Patient Active Problem List   Diagnosis Date Noted  . Depression, major, recurrent, moderate (HCC) 10/28/2015    Class: Chronic  . POLYCYSTIC OVARY 11/17/2006  . OBESITY, NOS 11/17/2006  . BIPOLAR DISORDER 11/17/2006  . TOBACCO DEPENDENCE 11/17/2006  . ASTHMA, UNSPECIFIED 11/17/2006  . HIRSUTISM 11/17/2006    Patient Centered Plan: Patient is on the following Treatment Plan(s):  Anxiety and Depression  Recommendations for Services/Supports/Treatments: Recommendations for Services/Supports/Treatments Recommendations For Services/Supports/Treatments: IOP (Intensive Outpatient Program)  Treatment Plan Summary:  Pt will attend [facility] daily to participate in group therapy and a psycho-educational group to learn better coping skills.  Encouraged support groups.  Follow up with Dr. Lolly Mustache and Boneta Lucks, PhD.  Referrals to Alternative Service(s): Referred to Alternative Service(s):   Place:   Date:   Time:    Referred to Alternative Service(s):   Place:   Date:   Time:    Referred to Alternative Service(s):   Place:   Date:   Time:    Referred to Alternative Service(s):   Place:   Date:   Time:     CLARK, RITA, M.Ed, CNA

## 2015-10-28 NOTE — Progress Notes (Signed)
Psychiatric Initial Adult Assessment   Patient Identification: Virginia Ballard MRN:  [id number] Date of Evaluation:  10/28/2015 Referral Source: self Chief Complaint:   Chief Complaint    Depression; Anxiety; Stress     Visit Diagnosis:    ICD-9-CM ICD-10-CM   1. Major depressive disorder, recurrent episode, moderate (HCC) 296.32 F33.1   2. Marijuana abuse 305.20 F12.10   3. Depression, major, recurrent, moderate (HCC) 296.32 F33.1    Diagnosis:   Patient Active Problem List   Diagnosis Date Noted  . Depression, major, recurrent, moderate (HCC) [F33.1] 10/28/2015    Priority: Medium    Class: Chronic  . POLYCYSTIC OVARY [E28.2] 11/17/2006  . OBESITY, NOS [E66.9] 11/17/2006  . BIPOLAR DISORDER [F31.9] 11/17/2006  . TOBACCO DEPENDENCE [F17.200] 11/17/2006  . ASTHMA, UNSPECIFIED [J45.909] 11/17/2006  . HIRSUTISM [L68.0] 11/17/2006   History of Present Illness:  Virginia Ballard has been depressed for about a year.  Her job seems to be the main problem in that she feels stressed by the demands of the work and her supervisors are not helpful when she needs them.  Home life is good and if she were not working she says she would likely be okay.  She wonders if she has ADHD and is positive for many of the symptoms.  She says she has low self esteem over weight as a child and facial hair as a teenager.  Always felt like th black sheep of her family.  Her 14 year old daughter lives in [place] with her mother and she is okay with that as it seems best for the daughter but she does miss her.  At work feels disorganized and in some vague way that she is not at her best and says she needs help to head off whatever it is that is bothering her.  Does have sadness and decreased interest and motivation. Elements:  Location:  depression. Quality:  decreased energy and motivation. Severity:  affecting her work on the job. Timing:  stressed at work. Duration:  1 year. Context:  as above. Associated  Signs/Symptoms: Depression Symptoms:  depressed mood, fatigue, difficulty concentrating, (Hypo) Manic Symptoms:  Irritable Mood, Anxiety Symptoms:  Excessive Worry, Psychotic Symptoms:  none PTSD Symptoms: Negative  Past Medical History:  Past Medical History  Diagnosis Date  . Medical history non-contributory   . Bipolar disorder (HCC)     diagnosed >2 yrs ago  . Depression     diagnosed >2 yrs ago    Past Surgical History  Procedure Laterality Date  . No past surgeries     Family History:  Family History  Problem Relation Age of Onset  . Alcohol abuse Father    Social History:   Social History   Social History  . Marital Status: Married    Spouse Name: N/A  . Number of Children: N/A  . Years of Education: N/A   Social History Main Topics  . Smoking status: Current Every Day Smoker -- 0.50 packs/day    Types: Cigarettes  . Smokeless tobacco: None  . Alcohol Use: No  . Drug Use: Yes    Special: Marijuana     Comment: one blunt daily (THC)  . Sexual Activity: Yes   Other Topics Concern  . None   Social History Narrative   Additional Social History: none  Musculoskeletal: Strength & Muscle Tone: within normal limits Gait & Station: normal Patient leans: N/A  Psychiatric Specialty Exam: HPI  ROS  There were no vitals taken  for this visit.There is no height or weight on file to calculate BMI.  General Appearance: Well Groomed  Eye Contact:  Good  Speech:  Clear and Coherent  Volume:  Normal  Mood:  Depressed  Affect:  Appropriate  Thought Process:  Coherent and Logical  Orientation:  Full (Time, Place, and Person)  Thought Content:  Negative  Suicidal Thoughts:  No  Homicidal Thoughts:  No  Memory:  Immediate;   Good Recent;   Good Remote;   Good  Judgement:  Intact  Insight:  Fair  Psychomotor Activity:  Normal  Concentration:  Good  Recall:  Good  Fund of Knowledge:Good  Language: Good  Akathisia:  Negative  Handed:  Right  AIMS (if  indicated):  0  Assets:  Communication Skills Desire for Improvement Financial Resources/Insurance Housing Intimacy Leisure Time Physical Health Resilience Social Support Talents/Skills Transportation Vocational/Educational  ADL's:  Intact  Cognition: WNL  Sleep:  adequate   Is the patient at risk to self?  No. Has the patient been a risk to self in the past 6 months?  No. Has the patient been a risk to self within the distant past?  No. Is the patient a risk to others?  No. Has the patient been a risk to others in the past 6 months?  No. Has the patient been a risk to others within the distant past?  No.  Allergies:  No Known Allergies Current Medications: No current outpatient prescriptions on file.   No current facility-administered medications for this visit.    Previous Psychotropic Medications: Yes   Substance Abuse History in the last 12 months:  No.  Consequences of Substance Abuse: Negative  Medical Decision Making:  Established Problem, Worsening (2)  Treatment Plan Summary: daily group therapy    Carolanne Grumbling 2/7/20171:52 PM

## 2015-10-29 ENCOUNTER — Other Ambulatory Visit (HOSPITAL_COMMUNITY): Payer: PRIVATE HEALTH INSURANCE | Admitting: Psychiatry

## 2015-10-29 NOTE — Progress Notes (Signed)
    Daily Group Progress Note  Program: IOP  Group Time: 9:00-10:30  Participation Level: Active  Behavioral Response: Appropriate  Type of Therapy:  Group Therapy  Summary of Progress: Pt. Met with case manager and psychiatrist. Pt. Introduced herself to the group, smiled and appropriately engaged in end of group process.     Group Time: 10:30-12:00  Participation Level:  Active  Behavioral Response: Appropriate  Type of Therapy: Psycho-education Group  Summary of Progress: Pt. Participated in yoga session facilitated by Jan Fireman.   Nancie Neas, LPC

## 2015-10-30 ENCOUNTER — Other Ambulatory Visit (HOSPITAL_COMMUNITY): Payer: PRIVATE HEALTH INSURANCE | Admitting: Psychiatry

## 2015-10-30 DIAGNOSIS — F331 Major depressive disorder, recurrent, moderate: Secondary | ICD-10-CM | POA: Diagnosis not present

## 2015-10-30 NOTE — Progress Notes (Signed)
    Daily Group Progress Note  Program: IOP  Group Time: 9:00-10:30  Participation Level: Active  Behavioral Response: Appropriate  Type of Therapy:  Group Therapy  Summary of Progress: Pt. Presents as talkative, engaged in the group process. Pt. Shared that she usually feels tearful in the morning, but has not cried this morning. Pt. Reported challenges with organization, spending, and decision-making. Pt. Participated in discussion about tools that she can use to enhance her discipline in spending i.e., use shopping service at grocery store, withdraw cash and create budget with envelopes, use a grocery store with fewer choices, have a friend as an accountability partner, use meal plans.     Group Time: 10:30-12:00  Participation Level:  Active  Behavioral Response: Appropriate  Type of Therapy: Group Therapy  Summary of Progress: Pt. Participated in discussion about patterns of control/manipulation and emotional abuse in relationships i.e., isolating from family and friends, gaslighting, financial dependence, and fear of abandonment.  Shaune Pollack, LPC

## 2015-10-31 ENCOUNTER — Other Ambulatory Visit (HOSPITAL_COMMUNITY): Payer: PRIVATE HEALTH INSURANCE | Admitting: Psychiatry

## 2015-10-31 DIAGNOSIS — F331 Major depressive disorder, recurrent, moderate: Secondary | ICD-10-CM

## 2015-10-31 NOTE — Progress Notes (Signed)
    Daily Group Progress Note  Program: IOP  Group Time: 9:00-10:30  Participation Level: Active  Behavioral Response: Appropriate  Type of Therapy:  Group Therapy  Summary of Progress: Pt. Presented with brightened affect, talkative, engaged in the group process. Pt. Discussed feeling misunderstood by her family members and pressured by their expectations of her. Pt. Discussed financial stress and pressure. Pt. Participated in discussion about development of self in order to resist comparison to others and expectations of others.      Group Time: 10:30-12:00  Participation Level:  Active  Behavioral Response: Appropriate  Type of Therapy: Psycho-education Group  Summary of Progress: Pt. Participated in discussion about development of self and seeking support from others.  Shaune Pollack, LPC

## 2015-11-03 ENCOUNTER — Other Ambulatory Visit (HOSPITAL_COMMUNITY): Payer: PRIVATE HEALTH INSURANCE

## 2015-11-04 ENCOUNTER — Other Ambulatory Visit (HOSPITAL_COMMUNITY): Payer: PRIVATE HEALTH INSURANCE | Admitting: Psychiatry

## 2015-11-04 DIAGNOSIS — F331 Major depressive disorder, recurrent, moderate: Secondary | ICD-10-CM | POA: Diagnosis not present

## 2015-11-04 DIAGNOSIS — F900 Attention-deficit hyperactivity disorder, predominantly inattentive type: Secondary | ICD-10-CM

## 2015-11-04 DIAGNOSIS — F909 Attention-deficit hyperactivity disorder, unspecified type: Secondary | ICD-10-CM | POA: Insufficient documentation

## 2015-11-04 MED ORDER — LISDEXAMFETAMINE DIMESYLATE 20 MG PO CAPS: 20.0000 mg | ORAL_CAPSULE | Freq: Every day | ORAL | Status: DC

## 2015-11-04 NOTE — Progress Notes (Signed)
    Daily Group Progress Note  Program: IOP  Group Time: 9:00-12:00  Participation Level: Active  Behavioral Response: Appropriate  Type of Therapy:  Group Therapy  Summary of Progress: Pt. Met briefly with the psychiatrist and case manager. Pt. Presented with brightened affect, talkative, engaged in group process. Pt. Shared with the group that she was "doing ok", recognizes her work as a significant stressor. Pt. Also discussed patterns of pleasing others especially her family and disconnecting from herself. Pt. Participated in extended group therapy session processing Pt. Experiences with relationship boundaries, guilt and shame related to communicating personal needs for others, patterns of excessive caregiving and tendencies to "save" others in relationships.   Nancie Neas, LPC

## 2015-11-05 ENCOUNTER — Other Ambulatory Visit (HOSPITAL_COMMUNITY): Payer: PRIVATE HEALTH INSURANCE | Admitting: Psychiatry

## 2015-11-05 DIAGNOSIS — F900 Attention-deficit hyperactivity disorder, predominantly inattentive type: Secondary | ICD-10-CM

## 2015-11-05 DIAGNOSIS — F331 Major depressive disorder, recurrent, moderate: Secondary | ICD-10-CM | POA: Diagnosis not present

## 2015-11-05 MED ORDER — AMPHETAMINE-DEXTROAMPHET ER 20 MG PO CP24: 20.0000 mg | ORAL_CAPSULE | Freq: Two times a day (BID) | ORAL | Status: DC

## 2015-11-05 MED ORDER — AMPHETAMINE-DEXTROAMPHET ER 20 MG PO CP24
20.0000 mg | ORAL_CAPSULE | Freq: Every day | ORAL | Status: DC
Start: 2015-11-05 — End: 2015-11-05

## 2015-11-05 NOTE — Progress Notes (Signed)
I diagnosed ADHD inattentive and will try stimulants to see if they help

## 2015-11-05 NOTE — Addendum Note (Signed)
Addended by: Carolanne Grumbling D on: 11/05/2015 11:03 AM   Modules accepted: Orders, Medications

## 2015-11-06 ENCOUNTER — Other Ambulatory Visit (HOSPITAL_COMMUNITY): Payer: PRIVATE HEALTH INSURANCE | Admitting: Psychiatry

## 2015-11-06 DIAGNOSIS — F331 Major depressive disorder, recurrent, moderate: Secondary | ICD-10-CM | POA: Diagnosis not present

## 2015-11-06 DIAGNOSIS — F900 Attention-deficit hyperactivity disorder, predominantly inattentive type: Secondary | ICD-10-CM

## 2015-11-06 MED ORDER — AMPHETAMINE-DEXTROAMPHETAMINE 10 MG PO TABS: 10.0000 mg | ORAL_TABLET | Freq: Two times a day (BID) | ORAL | Status: DC

## 2015-11-06 NOTE — Progress Notes (Signed)
    Daily Group Progress Note  Program: IOP  Group Time: 9:00-10:30  Participation Level: Active  Behavioral Response: Appropriate  Type of Therapy:  Group Therapy  Summary of Progress: Pt. Presented as engaged in group process, talkative, provides insightful feedback to group members. Pt. Shared with group that she is working on getting her medications but is discouraged because of the cost. Pt. Was given resources from group members regarding accessing her medications at a lower cost.      Group Time: 10:30-12:00  Participation Level:  Active  Behavioral Response: Appropriate  Type of Therapy: Psycho-education Group  Summary of Progress: Pt. Participated in yoga therapy group facilitated by Forde Radon, LPC.  Shaune Pollack, LPC

## 2015-11-07 ENCOUNTER — Other Ambulatory Visit (HOSPITAL_COMMUNITY): Payer: PRIVATE HEALTH INSURANCE | Admitting: Psychiatry

## 2015-11-07 DIAGNOSIS — F331 Major depressive disorder, recurrent, moderate: Secondary | ICD-10-CM | POA: Diagnosis not present

## 2015-11-07 DIAGNOSIS — F900 Attention-deficit hyperactivity disorder, predominantly inattentive type: Secondary | ICD-10-CM

## 2015-11-07 NOTE — Progress Notes (Signed)
    Daily Group Progress Note  Program: IOP  Group Time: 9:00-10:30  Participation Level: Active  Behavioral Response: Appropriate  Type of Therapy:  Group Therapy  Summary of Progress: Pt. Presented as anxious and overwhelmed. Pt. Attributed her mood state to concerns about money and communicating her concerns to her boyfriend. Pt. Discussed patterns of shame and avoidance and fear that her boyfriend will see her as irresponsible and leave her.      Group Time: 10:30-12:00  Participation Level:  Active  Behavioral Response: Appropriate  Type of Therapy: Psycho-education Group  Summary of Progress: Pt. Participated in grief and loss group with Graciela Husbands.  Shaune Pollack, [facility]

## 2015-11-07 NOTE — Progress Notes (Signed)
    Daily Group Progress Note  Program: IOP  Group Time: 9:00-10:30  Participation Level: Active  Behavioral Response: Appropriate  Type of Therapy:  Group Therapy  Summary of Progress: Pt. Presented as talkative and engaged in the group process. Pt. Discussed work stress, time management, and budgeting her time for self-care.     Group Time: 10:30-12:00  Participation Level:  Active  Behavioral Response: Appropriate  Type of Therapy: Psycho-education Group  Summary of Progress: Pt. Participated in discussion about Dewain Penning developing vulnerability to manage shame and avoidance.   Shaune Pollack, LPC

## 2015-11-10 ENCOUNTER — Other Ambulatory Visit (HOSPITAL_COMMUNITY): Payer: PRIVATE HEALTH INSURANCE

## 2015-11-11 ENCOUNTER — Telehealth (HOSPITAL_COMMUNITY): Payer: Self-pay | Admitting: Psychiatry

## 2015-11-11 ENCOUNTER — Other Ambulatory Visit (HOSPITAL_COMMUNITY): Payer: PRIVATE HEALTH INSURANCE

## 2015-11-12 ENCOUNTER — Other Ambulatory Visit (HOSPITAL_COMMUNITY): Payer: PRIVATE HEALTH INSURANCE | Admitting: Psychiatry

## 2015-11-12 DIAGNOSIS — F331 Major depressive disorder, recurrent, moderate: Secondary | ICD-10-CM | POA: Diagnosis not present

## 2015-11-12 DIAGNOSIS — F900 Attention-deficit hyperactivity disorder, predominantly inattentive type: Secondary | ICD-10-CM

## 2015-11-13 ENCOUNTER — Other Ambulatory Visit (HOSPITAL_COMMUNITY): Payer: PRIVATE HEALTH INSURANCE

## 2015-11-13 NOTE — Progress Notes (Signed)
    Daily Group Progress Note  Program: IOP  Group Time: 9:00-12:00  Participation Level: Active  Behavioral Response: Appropriate  Type of Therapy:  Group Therapy  Summary of Progress: Pt. Presented as calm and with brightened affect, talkative, engaged in the group process. Pt. Reported that she was taking medication for ADHD and felt more focused and ability to concentrate on decision making and greater frustration tolerance at work. Pt. Reported that she was able to follow through with a conversation with her partner regarding her fears about money and that her feelings were validated, her partner was supportive and that she felt more secure in her relationship.     Shaune Pollack, LPC

## 2015-11-14 ENCOUNTER — Other Ambulatory Visit (HOSPITAL_COMMUNITY): Payer: PRIVATE HEALTH INSURANCE | Admitting: Psychiatry

## 2015-11-17 ENCOUNTER — Other Ambulatory Visit (HOSPITAL_COMMUNITY): Payer: PRIVATE HEALTH INSURANCE

## 2015-11-18 ENCOUNTER — Other Ambulatory Visit (HOSPITAL_COMMUNITY): Payer: PRIVATE HEALTH INSURANCE | Admitting: Psychiatry

## 2015-11-18 ENCOUNTER — Ambulatory Visit (HOSPITAL_COMMUNITY): Payer: PRIVATE HEALTH INSURANCE | Admitting: Psychiatry

## 2015-11-18 DIAGNOSIS — F331 Major depressive disorder, recurrent, moderate: Secondary | ICD-10-CM | POA: Diagnosis not present

## 2015-11-18 DIAGNOSIS — F900 Attention-deficit hyperactivity disorder, predominantly inattentive type: Secondary | ICD-10-CM

## 2015-11-18 NOTE — Progress Notes (Signed)
    Daily Group Progress Note  Program: IOP  Group Time: 9:00-12:00  Participation Level: Active  Behavioral Response: Appropriate  Type of Therapy:  Group Therapy  Summary of Progress: Pt. Presented with bright affect, talkative, engaged in the group process. Pt. Shared recent visit to see her father and freeing herself from sense of obligation to her parents. Pt. Discussed workplace dissatisfaction that is connected to lack of trust for her supervisors and values disconnect with her company. Pt. Received feedback from group regarding clarifying her personal values and creating 3-60-90 day plan to transition from her current employer.      Shaune Pollack, LPC

## 2015-11-19 ENCOUNTER — Other Ambulatory Visit (HOSPITAL_COMMUNITY): Payer: PRIVATE HEALTH INSURANCE | Attending: Psychiatry | Admitting: Psychiatry

## 2015-11-19 DIAGNOSIS — F900 Attention-deficit hyperactivity disorder, predominantly inattentive type: Secondary | ICD-10-CM

## 2015-11-19 DIAGNOSIS — F331 Major depressive disorder, recurrent, moderate: Secondary | ICD-10-CM | POA: Insufficient documentation

## 2015-11-20 ENCOUNTER — Ambulatory Visit (HOSPITAL_COMMUNITY): Payer: Self-pay | Admitting: Psychiatry

## 2015-11-20 ENCOUNTER — Other Ambulatory Visit (HOSPITAL_COMMUNITY): Payer: PRIVATE HEALTH INSURANCE | Admitting: Psychiatry

## 2015-11-20 DIAGNOSIS — F331 Major depressive disorder, recurrent, moderate: Secondary | ICD-10-CM | POA: Diagnosis not present

## 2015-11-20 DIAGNOSIS — F900 Attention-deficit hyperactivity disorder, predominantly inattentive type: Secondary | ICD-10-CM

## 2015-11-21 ENCOUNTER — Other Ambulatory Visit (HOSPITAL_COMMUNITY): Payer: PRIVATE HEALTH INSURANCE | Admitting: Psychiatry

## 2015-11-21 DIAGNOSIS — F331 Major depressive disorder, recurrent, moderate: Secondary | ICD-10-CM | POA: Diagnosis not present

## 2015-11-21 DIAGNOSIS — F900 Attention-deficit hyperactivity disorder, predominantly inattentive type: Secondary | ICD-10-CM

## 2015-11-21 NOTE — Progress Notes (Signed)
    Daily Group Progress Note  Program: IOP  Group Time: 9:00-12:00  Participation Level: Active  Behavioral Response: Appropriate  Type of Therapy:  Group Therapy  Summary of Progress: Pt. Presented with brightened affect, engaged in group process, calm and receptive to feedback from others. Pt. Continues to process work related stress and taking time for herself by clocking out when she gets overwhelmed at work. Participated in discussion of self-compassion i.e., words of self-kindness, acceptance of common humanity, and mindfulness and how to use self-compassion to cope with difficult emotions.      Shaune PollackBrown, Jennifer B, LPC

## 2015-11-21 NOTE — Progress Notes (Signed)
    Daily Group Progress Note  Program: IOP    Group Time: 9:00-12:00  Participation Level: Active  Behavioral Response: Appropriate  Type of Therapy: Group Therapy  Summary of Progress: Pt. Presents as talkative, engaged in the group process, receptive of feedback from the group. Pt. Reports that things are getting easier for her to manage at work. Pt. Reports that she also feels more confident in her relationship with her partner because of improved communication which has lessened her fear of rejection by him.      Shaune PollackBrown, Jennifer B, LPC

## 2015-11-24 ENCOUNTER — Other Ambulatory Visit (HOSPITAL_COMMUNITY): Payer: PRIVATE HEALTH INSURANCE

## 2015-11-24 NOTE — Progress Notes (Signed)
    Daily Group Progress Note  Program: IOP  Group Time: 9:00-10:30  Participation Level: Active  Behavioral Response: Appropriate  Type of Therapy:  Group Therapy  Summary of Progress: Pt. Participated in grounding exercise and meditation. Pt. Presented with brightened affect, talkative, engaged in the group process. Pt. Reported that she felt that things were getting better and she was coping more effectively with her work environment.     Group Time: 10:30-12:00  Participation Level:  Active  Behavioral Response: Appropriate  Type of Therapy: Psycho-education Group  Summary of Progress: Pt. Did not attend grief and loss due to previously scheduled appointment.  Shaune PollackBrown, Jennifer B, LPC

## 2015-11-25 ENCOUNTER — Other Ambulatory Visit (HOSPITAL_COMMUNITY): Payer: PRIVATE HEALTH INSURANCE

## 2015-11-26 ENCOUNTER — Other Ambulatory Visit (HOSPITAL_COMMUNITY): Payer: PRIVATE HEALTH INSURANCE | Admitting: Psychiatry

## 2015-11-26 DIAGNOSIS — F900 Attention-deficit hyperactivity disorder, predominantly inattentive type: Secondary | ICD-10-CM

## 2015-11-26 DIAGNOSIS — F331 Major depressive disorder, recurrent, moderate: Secondary | ICD-10-CM | POA: Diagnosis not present

## 2015-11-26 NOTE — Progress Notes (Signed)
Comprehensive Clinical Assessment (CCA) Note  11/26/2015 [place] Ballard [id number]  Visit Diagnosis:   No diagnosis found.    CCA Part One  Part One has been completed on paper by the patient.  (See scanned document in Chart Review)  CCA Part Two A  Intake/Chief Complaint:  CCA Intake With Chief Complaint CCA Part Two Date: 10/28/15 CCA Part Two Time: 1414 Chief Complaint/Presenting Problem: This is a 25 yr. old, single, employed [place] American female, who was referred per Boneta Lucks, PhD; treatment for ongoing depressive and anxiety symptoms.  Denies SI/HI or A/V hallucinations.  Pt relocated from [place] in August 2016; but states the symptoms started to worsen in January 2017.   Triggers/Stressors:  1)   Unresolved grief/loss issues:  Maternal Grandparents to whom pt was very close to.  Grandfather died in Feb 10, 2007 and Grandmother in 10-Feb-2012.  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  Pt states due to the depression, she has missed a lot of days.  Reports that she has been written up.  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  Pt works twelve hour shifts on Saturday, Sunday and Monday.  Pt admits to two prior psychiatric hospitalizations (one for depression and the other suicide attempt.)  Pt admits to hx of self injurious behaviors (cutting wrists) yrs ago.  Family Hx:  Father (ETOH)                                                                                                                      Patients Currently Reported Symptoms/Problems: isolation, anxiety, tearfulness Collateral Involvement: Sister in [place], [place] is very supportive. Individual's Strengths: Pt is motivated for treatment. Type of Services Patient Feels Are Needed: counseling  Mental Health Symptoms Depression:  Depression: Difficulty Concentrating, Hopelessness, Irritability  Mania:     Anxiety:   Anxiety: Difficulty  concentrating, Irritability, Restlessness, Worrying, Tension  Psychosis:  Psychosis: N/A  Trauma:  Trauma: N/A  Obsessions:  Obsessions: Cause anxiety  Compulsions:  Compulsions: N/A  Inattention:  Inattention: Forgetful, Poor follow-through on tasks  Hyperactivity/Impulsivity:     Oppositional/Defiant Behaviors:  Oppositional/Defiant Behaviors: Easily annoyed  Borderline Personality:  Emotional Irregularity: Chronic feelings of emptiness, Mood lability  Other Mood/Personality Symptoms:      Mental Status Exam Appearance and self-care  Stature:  Stature: Average  Weight:  Weight: Average weight  Clothing:  Clothing: Casual  Grooming:  Grooming: Normal  Cosmetic use:  Cosmetic Use: Age appropriate  Posture/gait:     Motor activity:  Motor Activity: Not Remarkable  Sensorium  Attention:  Attention: Normal  Concentration:  Concentration: Normal  Orientation:  Orientation: X5  Recall/memory:  Recall/Memory: Normal  Affect and Mood  Affect:  Affect: Anxious, Depressed  Mood:  Mood: Anxious  Relating  Eye contact:  Eye Contact: Normal  Facial expression:  Facial Expression: Responsive  Attitude toward examiner:  Attitude Toward  Examiner: Cooperative  Thought and Language  Speech flow: Speech Flow: Normal  Thought content:  Thought Content: Appropriate to mood and circumstances  Preoccupation:     Hallucinations:     Organization:     Company secretary of Knowledge:  Fund of Knowledge: Average  Intelligence:  Intelligence: Average  Abstraction:  Abstraction: Normal  Judgement:  Judgement: Normal  Reality Testing:  Museum/gallery exhibitions officer  Insight:     Decision Making:     Social Functioning  Social Maturity:  Social Maturity: Responsible  Social Judgement:  Social Judgement: Normal  Stress  Stressors:  Stressors: Scientist, forensic Ability:  Coping Ability: Building surveyor Deficits:     Supports:      Family and Psychosocial History: Family history Marital  status: Single Are you sexually active?: Yes Does patient have children?: Yes How many children?: 1 How is patient's relationship with their children?: good relationship; child lives with pt.'s parents  Childhood History:  Childhood History By whom was/is the patient raised?: Both parents Additional childhood history information: Pt was born in [place], [place].  Father was in the [employer] Garment/textile technologist).  He was stationed in [place], before the family moved to [place].  Pt was very close to her father.  "I held him on a pedestal, he couldn't do any wrong in my eyes.  I wanted to be with him all the time."  According to pt, her childhood was very chaotic.  "My parents argued all the time.  Later on I was told by my sister that our father was having a lot of affairs."  Pt states that news hit her very hard, "but it explained all the arguing."  Pt states that school was her safe place.  "I could get away from all the bickering at home."  Pt reports she was a shy, non-trusting, low self-esteem student, who would sit only in the back of the class.  Reports difficulty focusing in school.                                                                               Description of patient's relationship with caregiver when they were a child: Pt was very close to father. Patient's description of current relationship with people who raised him/her: Still closest to father as opposed to mother. 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?: No Are There Guns or Other Weapons in Your Home?: No Are These Weapons Safely Secured?: Yes  Education: Education School Currently Attending: n/a Last Grade Completed: 12 Did Garment/textile technologist From [employer]?: Yes Did Theme park manager?: Yes What Type of College Degree Do you Have?: attended [employer] for 1 1/2 years Did You Attend Graduate School?: No Did You Have An Individualized Education Program (IIEP): No Did You Have Any Difficulty At School?: No Were Any Medications Ever Prescribed For These Difficulties?: No  Religion: Religion/Spirituality Are You A Religious Person?: Yes What is Your Religious Affiliation?: Baptist How Might This Affect Treatment?: n/a  Leisure/Recreation: Leisure / Recreation Leisure and Hobbies: being at home watching tv  Exercise/Diet: Exercise/Diet Do You Exercise?: No Have You Gained or Lost A Significant Amount of Weight in the Past Six Months?: Yes-Gained Number of Pounds Gained: 15 Do You Follow a Special Diet?: No Do You Have Any Trouble Sleeping?: No  CCA Part Two C  Alcohol/Drug Use: Alcohol / Drug Use Pain Medications: none Prescriptions: none Over the Counter: none Substance #1 Name of Substance 1: cannabis 1 - Age of First Use: 14 1 - Amount (size/oz): half gram/day 1 - Frequency: daily 1 - Duration: daily for about twenty years 1 - Last Use / Amount: last night                     CCA Part Three  ASAM's:  Six Dimensions of Multidimensional Assessment  Dimension 1:  Acute Intoxication and/or Withdrawal Potential:     Dimension 2:  Biomedical Conditions and Complications:     Dimension 3:  Emotional, Behavioral, or Cognitive Conditions and Complications:     Dimension 4:  Readiness to Change:     Dimension 5:  Relapse, Continued use, or Continued Problem Potential:     Dimension 6:  Recovery/Living Environment:      Substance use Disorder (SUD)    Social Function:  Social Functioning Social Maturity: Responsible Social Judgement: Normal  Stress:  Stress Stressors: Money Coping Ability: Overwhelmed Patient Takes Medications The Way The Doctor Instructed?: Yes Priority Risk: Moderate Risk  Risk Assessment- Self-Harm Potential: Risk Assessment For Self-Harm Potential Thoughts of Self-Harm: No current thoughts Method: No plan Availability of Means: No access/NA  Risk Assessment -Dangerous to Others Potential: Risk Assessment For Dangerous to Others Potential Method: No Plan Availability of Means: No access or NA Intent: Vague intent or NA Notification Required: No need or identified person  DSM5 Diagnoses: Patient Active Problem List   Diagnosis Date Noted  . Attention deficit hyperactivity disorder (ADHD) 11/04/2015    Class: Chronic  . Depression, major, recurrent, moderate (HCC) 10/28/2015    Class: Chronic  . POLYCYSTIC OVARY 11/17/2006  . OBESITY, NOS 11/17/2006  . BIPOLAR DISORDER 11/17/2006  . TOBACCO DEPENDENCE 11/17/2006  . ASTHMA, UNSPECIFIED 11/17/2006  . HIRSUTISM 11/17/2006     Patient Centered Plan: Patient is on the following Treatment Plan(s):  Anxiety and Depression  Recommendations for Services/Supports/Treatments: Recommendations for Services/Supports/Treatments Recommendations For Services/Supports/Treatments: IOP (Intensive Outpatient Program)  Treatment Plan Summary:  Attend MH-IOP daily.  F/U with  support groups, Dr. Lolly Mustache and Boneta Lucks, PhD.  Referrals to Alternative Service(s): Referred to Alternative Service(s):   Place:   Date:   Time:    Referred to Alternative Service(s):  Place:   Date:   Time:    Referred to Alternative Service(s):   Place:   Date:   Time:    Referred to Alternative Service(s):   Place:   Date:   Time:     Chestine SporeCLARK, [place]

## 2015-11-27 ENCOUNTER — Other Ambulatory Visit (HOSPITAL_COMMUNITY): Payer: PRIVATE HEALTH INSURANCE | Admitting: Psychiatry

## 2015-11-27 DIAGNOSIS — F331 Major depressive disorder, recurrent, moderate: Secondary | ICD-10-CM | POA: Diagnosis not present

## 2015-11-27 DIAGNOSIS — F900 Attention-deficit hyperactivity disorder, predominantly inattentive type: Secondary | ICD-10-CM

## 2015-11-27 NOTE — Progress Notes (Signed)
    Daily Group Progress Note  Program: IOP  Group Time: 9:00-12:00  Participation Level: Active  Behavioral Response: Appropriate  Type of Therapy:  Group Therapy  Summary of Progress: Pt. Presents as engaged in group process, talkative, provides and receives feedback for the group. Pt. Reports that "things are better at work" and that she finds the ability laugh in the pain better now.    Shaune PollackBrown, Jennifer B, LPC

## 2015-11-27 NOTE — Progress Notes (Signed)
Patient ID: Virginia Ballard, female   DOB: [birth year], 35 y.o.   MRN: [id number] Discharge Note  Patient:  Virginia Ballard is an 35 y.o., female DOB:  [birth year]  Date of Admission:  10/28/2015  Date of Discharge:  11/28/2015  Reason for Admission:depression  IOP Course:  Attended and participated.  Did very well, said the program was useful in having somebody to talk to and listening to others was very good in that she could see what to do and not do.  Feeling much better about herself and glad she came.  Mental Status at Discharge:depression much decreased, optimistic about herself and her future  Lab Results: No results found for this or any previous visit (from the past 48 hour(s)).   Current outpatient prescriptions:  .  amphetamine-dextroamphetamine (ADDERALL) 10 MG tablet, Take 1 tablet (10 mg total) by mouth 2 (two) times daily with a meal., Disp: 60 tablet, Rfl: 0  Axis Diagnosis:  Major depression, recurrent, moderate   Level of Care:  IOP  Discharge destination:  Other:  has appointments with a psychiatrist and therapist  Is patient on multiple antipsychotic therapies at discharge:  No    Has Patient had three or more failed trials of antipsychotic monotherapy by history:  Negative  Patient phone:  [phone] (home)  Patient address:   [address] [place] [id number],   Follow-up recommendations:  Activity:  continue current activity Diet:  continue current diet  Comments:  none  The patient received suicide prevention pamphlet:  Yes  Carolanne GrumblingGerald Viviane Semidey 11/27/2015, 1:36 PM

## 2015-11-28 ENCOUNTER — Other Ambulatory Visit (HOSPITAL_COMMUNITY): Payer: PRIVATE HEALTH INSURANCE | Admitting: Psychiatry

## 2015-11-28 DIAGNOSIS — F331 Major depressive disorder, recurrent, moderate: Secondary | ICD-10-CM | POA: Diagnosis not present

## 2015-11-28 DIAGNOSIS — F900 Attention-deficit hyperactivity disorder, predominantly inattentive type: Secondary | ICD-10-CM

## 2015-11-28 NOTE — Progress Notes (Signed)
    Daily Group Progress Note  Program: IOP  Group Time: 9:00-10:30  Participation Level: Active  Behavioral Response: Appropriate  Type of Therapy:  Group Therapy  Summary of Progress: Pt. Participated in discharge planning group with the case manager. Pt. Is planning to discharge from group tomorrow.     Group Time: 10:30-12:00  Participation Level:  Active  Behavioral Response: Appropriate  Type of Therapy: Group Therapy  Summary of Progress: Pt. Presented with euthymic mood, talkative. Pt. Reported to the group that she was doing "ok" and that she was not feeling anxiety about work.  Shaune PollackBrown, Jennifer B, LPC

## 2015-11-28 NOTE — Patient Instructions (Signed)
Patient completed MH-IOP today.  Will follow up with Dr. Lolly MustacheArfeen on 12-17-15 @ 2 pm and Boneta LucksJennifer Brown, [facility] on 12-30-15 @ 1 pm.  Encouraged support groups.

## 2015-11-28 NOTE — Progress Notes (Signed)
Carey Bullocksonya S Niblett is a 35 y.o. , single, employed [place] female, who was referred per Boneta LucksJennifer Brown, PhD; treatment for ongoing depressive and anxiety symptoms. Denied SI/HI or A/V hallucinations. [place]; but stated the symptoms started to worsen in January 2017. Triggers/Stressors: 1) Unresolved grief/loss issues: Maternal Grandparents to whom pt was very close to. Grandfather died in 2008 and Grandmother in 2013. 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 Pt states due to the depression, she has missed a lot of days. Reports that she has been written up. There has been a lot of changes due to company being bought out by another company Materials engineer([employer]) in January 2017. Pt works twelve hour shifts on Saturday, Sunday and Monday. Pt admits to two prior psychiatric hospitalizations (one for depression and the other suicide attempt.) Pt admits to hx of self injurious behaviors (cutting wrists) yrs ago. Family Hx: Father (ETOH)  Pt completed MH-IOP today.  Reports improved depressive and anxiety symptoms.  States she continues to struggle with sadness, no motivation, irritability and indecisiveness at times; but overall is feeling much better.  Pt will continue to work on relationship, boundaries, and grief/loss issues.  A:  D/C today.  F/U with Dr. Lolly MustacheArfeen on 12-17-15 at 2 pm and Boneta LucksJennifer Brown, PhD on 12-30-15 @ 1 pm.  Encouraged support groups.  R:  Pt receptive.     Chestine SporeLARK, RITA, M.Ed, CNA

## 2015-12-01 ENCOUNTER — Other Ambulatory Visit (HOSPITAL_COMMUNITY): Payer: PRIVATE HEALTH INSURANCE

## 2015-12-01 NOTE — Progress Notes (Signed)
    Daily Group Progress Note  Program: IOP  Group Time: 9:00-10:30  Participation Level: Active  Behavioral Response: Appropriate  Type of Therapy:  Group Therapy  Summary of Progress: Pt. Presented with bright affect, did not report symptoms of anxiety or depression. Pt. Prepared for discharge and discussed feeling hopeful about the future.     Group Time: 10:30-12:00  Participation Level:  Active  Behavioral Response: Appropriate  Type of Therapy: Psycho-education Group  Summary of Progress: Pt. Participated in grief and loss group facilitated by Theda BelfastBob Hamilton.  Shaune PollackBrown, Malaysha Arlen B, LPC

## 2015-12-02 ENCOUNTER — Other Ambulatory Visit (HOSPITAL_COMMUNITY): Payer: PRIVATE HEALTH INSURANCE

## 2015-12-03 ENCOUNTER — Other Ambulatory Visit (HOSPITAL_COMMUNITY): Payer: PRIVATE HEALTH INSURANCE

## 2015-12-04 ENCOUNTER — Other Ambulatory Visit (HOSPITAL_COMMUNITY): Payer: PRIVATE HEALTH INSURANCE

## 2015-12-05 ENCOUNTER — Other Ambulatory Visit (HOSPITAL_COMMUNITY): Payer: PRIVATE HEALTH INSURANCE

## 2015-12-08 ENCOUNTER — Other Ambulatory Visit (HOSPITAL_COMMUNITY): Payer: PRIVATE HEALTH INSURANCE

## 2015-12-09 ENCOUNTER — Other Ambulatory Visit (HOSPITAL_COMMUNITY): Payer: PRIVATE HEALTH INSURANCE

## 2015-12-17 ENCOUNTER — Encounter (HOSPITAL_COMMUNITY): Payer: Self-pay | Admitting: Psychiatry

## 2015-12-17 ENCOUNTER — Other Ambulatory Visit (HOSPITAL_COMMUNITY)
Admission: RE | Admit: 2015-12-17 | Discharge: 2015-12-17 | Disposition: A | Discharge status: Home / Self Care | Payer: No Typology Code available for payment source | Source: Ambulatory Visit | Attending: Family Medicine | Admitting: Family Medicine

## 2015-12-17 ENCOUNTER — Ambulatory Visit (INDEPENDENT_AMBULATORY_CARE_PROVIDER_SITE_OTHER): Payer: PRIVATE HEALTH INSURANCE | Admitting: Psychiatry

## 2015-12-17 ENCOUNTER — Encounter: Payer: Self-pay | Admitting: Internal Medicine

## 2015-12-17 ENCOUNTER — Ambulatory Visit (INDEPENDENT_AMBULATORY_CARE_PROVIDER_SITE_OTHER): Payer: PRIVATE HEALTH INSURANCE | Admitting: Internal Medicine

## 2015-12-17 VITALS — BP 130/93 | HR 94 | Temp 98.2°F | Ht 66.0 in | Wt 186.4 lb

## 2015-12-17 VITALS — BP 140/82 | HR 74 | Ht 66.0 in | Wt 189.4 lb

## 2015-12-17 DIAGNOSIS — Z124 Encounter for screening for malignant neoplasm of cervix: Secondary | ICD-10-CM

## 2015-12-17 DIAGNOSIS — Z01419 Encounter for gynecological examination (general) (routine) without abnormal findings: Secondary | ICD-10-CM | POA: Insufficient documentation

## 2015-12-17 DIAGNOSIS — F319 Bipolar disorder, unspecified: Secondary | ICD-10-CM

## 2015-12-17 DIAGNOSIS — Z202 Contact with and (suspected) exposure to infections with a predominantly sexual mode of transmission: Secondary | ICD-10-CM | POA: Diagnosis not present

## 2015-12-17 DIAGNOSIS — R109 Unspecified abdominal pain: Secondary | ICD-10-CM | POA: Diagnosis not present

## 2015-12-17 DIAGNOSIS — N898 Other specified noninflammatory disorders of vagina: Secondary | ICD-10-CM

## 2015-12-17 DIAGNOSIS — F3130 Bipolar disorder, current episode depressed, mild or moderate severity, unspecified: Secondary | ICD-10-CM

## 2015-12-17 DIAGNOSIS — Z1151 Encounter for screening for human papillomavirus (HPV): Secondary | ICD-10-CM | POA: Insufficient documentation

## 2015-12-17 DIAGNOSIS — F1210 Cannabis abuse, uncomplicated: Secondary | ICD-10-CM | POA: Diagnosis not present

## 2015-12-17 LAB — POCT WET PREP (WET MOUNT)
CLUE CELLS WET PREP WHIFF POC: POSITIVE
WBC, Wet Prep HPF POC: >20

## 2015-12-17 LAB — POCT URINE PREGNANCY: PREG TEST UR: NEGATIVE

## 2015-12-17 MED ORDER — METRONIDAZOLE 500 MG PO TABS
2000.0000 mg | ORAL_TABLET | Freq: Once | ORAL | Status: AC
  Administered 2015-12-17: 2000 mg via ORAL

## 2015-12-17 MED ORDER — LAMOTRIGINE 25 MG PO TABS: ORAL_TABLET | ORAL | Status: DC

## 2015-12-17 NOTE — Patient Instructions (Addendum)
It was nice meeting you today Ms. Bloomquist!  We have given you Flagyl in the office today to treat for trichomonas. Your partner needs to be treated as well, so please encourage him to see a doctor as soon as possible. If your symptoms return, please schedule another appointment.   I will let you know the results of your pap smear when they return.   If you have any questions or concerns, please feel free to call the office at any time.   Be well,  Dr. Natale MilchLancaster

## 2015-12-17 NOTE — Assessment & Plan Note (Signed)
See "vaginal discharge" from same date for details.  - Treated with Flagyl 2g PO x1 in office today

## 2015-12-17 NOTE — Progress Notes (Signed)
35 y.o. year old female presents for well woman/preventative visit and annual GYN examination.  Acute Concerns:  Vaginal discharge Patient reports malodorous vaginal discharge for the past two years. She completed one course of Flagyl in 2015 with symptomatic resolution, however her symptoms quickly returned and have persisted since. She denies vaginal itching or bleeding. She endorses mild abdominal pain. She does not have regular monthly periods, but has had Mirena for the past four years. She has a history of chlamydia treated in the early 2000s. She denies dysuria or hematuria. She douches twice weekly.   Depression Patient has been in behavioral health group treatment over the past few months. The therapy sessions ended two weeks ago. The patient says she had previously been diagnosed with borderline personality disorder (1999), then with ADHD (2016), but was diagnosed with bipolar disorder by a new psychiatrist (Dr. Kathryne Sharper) today. She was previously taking Aderall, however this was stopped today, and she is to begin Lamictal per Dr. Lolly Mustache. She has taken Seoquel, lithium, Haldol, and Trazodone in the past. She was hospitalized once in 2008 for suicide attempt by cutting, and again in 2013 for suicidal ideation. She reports suicidal ideation again in 2015, but says she did not attempt suicide at this time because of her daughter. Of note, her daughter is now living in [facility] with the patient's parents, which is a major stressor in her life. She reports episodes of mania, including excessive energy and impulsive spending; last episode was three months ago. She denies recent suicidal or homicidal ideation.   Sexual/Birth History: Currently sexually active with one partner. Does not use protection.  [id number] Last pregnancy 13 years ago. Vaginal with no complications.   Birth Control: Mirena (placed 2013)  Social:  Social History   Social History  . Marital Status: Married   Spouse Name: N/A  . Number of Children: N/A  . Years of Education: N/A   Social History Main Topics  . Smoking status: Current Every Day Smoker -- 0.50 packs/day    Types: Cigarettes  . Smokeless tobacco: None  . Alcohol Use: No  . Drug Use: Yes    Special: Marijuana     Comment: one blunt daily (THC)  . Sexual Activity: Yes   Other Topics Concern  . None   Social History Narrative    Immunization: Immunization History  Administered Date(s) Administered  . Td 09/20/1997    Cancer Screening:  Pap Smear: performed today  Mammogram: not indicated  Colonoscopy: not indicated  Dexa: not indicated  Physical Exam: VITALS: Reviewed GEN: Tearful at times throughout interview; well-developed, well-nourished, in NAD HEENT: Normocephalic, PERRL, EOMI, no scleral icterus, nasal septum midline, MMM, uvula midline CARDIAC:RRR, S1 and S2 present, no murmur, no heaves/thrills RESP: CTAB, normal effort, no wheezes or rhonchi ABD: soft, no tenderness, normal bowel sounds GU/GYN:Exam performed in the presence of a chaperone. Normal external genitalia. Cervix unremarkable without cervical motion tenderness. Bimanual exam identified no masses. Mild amount of malodorous white discharge.  SKIN: accessory hair present on chin and chest; hyperpigmented lesions on both arms   ASSESSMENT & PLAN: 35 y.o. female presents for annual well woman/preventative exam and GYN exam. Please see problem specific assessment and plan.   Vaginal discharge Chronic over past two years. Gonorrhea/chlamydia and HIV screening from 11/21/15 negative. No cervical motion tenderness or strawberry cervix on pelvic exam today, so less concern for PID. Many trich seen on wet prep.  - Flagyl 2g PO x1 in office today -  Patient instructed to schedule appointment if symptoms return   Trichomonas contact, treated See "vaginal discharge" from same date for details.  - Treated with Flagyl 2g PO x1 in office today  Bipolar I  disorder (HCC) Followed by psychiatrist (Dr. Kathryne SharperSyed Arfeen). Started on Lamictal by Dr. Lolly MustacheArfeen today. Not currently manic, and denies SI/HI.  - Continue Lamictal as prescribed by Dr. Lolly MustacheArfeen - Has f/u appointment with Arfeen in three weeks  Health maintenance - Pap smear with HPV co-testing performed today - F/u in one year for annual well woman visit  Tarri AbernethyAbigail J Gifford Ballon, MD PGY-1 [facility] Pager [phone]

## 2015-12-17 NOTE — Assessment & Plan Note (Signed)
Followed by psychiatrist (Dr. Kathryne SharperSyed Arfeen). Started on Lamictal by Dr. Lolly MustacheArfeen today. Not currently manic, and denies SI/HI.  - Continue Lamictal as prescribed by Dr. Lolly MustacheArfeen - Has f/u appointment with Arfeen in three weeks

## 2015-12-17 NOTE — Assessment & Plan Note (Signed)
Chronic over past two years. Gonorrhea/chlamydia and HIV screening from 11/21/15 negative. No cervical motion tenderness or strawberry cervix on pelvic exam today, so less concern for PID. Many trich seen on wet prep.  - Flagyl 2g PO x1 in office today - Patient instructed to schedule appointment if symptoms return

## 2015-12-17 NOTE — Progress Notes (Signed)
Patient ID: Virginia Ballard, female   DOB: [birth year], 35 y.o.   MRN: [id number]  [facility] Initial Assessment Note  Virginia Ballard [id number] 35 y.o.  12/17/2015 3:27 PM  Chief Complaint:  I was referred from the program.  I don't thing medicine working.  History of Present Illness:  Patient is 35 year old African-American, single, employed female who is referred from intensive outpatient program for the management of her psychiatric illness.  Patient finished intensive outpatient program 3 weeks ago.  Patient referred to intensive outpatient program due to severe depression and feeling hopeless helpless and worthless.  At that time she was complaining of low self-esteem, overweight and excessive discouragement and feeling like she is a black sheep of the family.  She also endorse fatigue and lack of energy and wondering if she can try on ADD medication.  Patient was given Ritalin 20 mg and she taken up to 30 mg with limited response.  She do not see any improvement in her symptoms.  Patient admitted that she has diagnosed with bipolar disorder in the past and has been noncompliant with her psychiatric medication for more than 2 years.  She used to take Lamictal, Celexa and Seroquel.  However when she moved from [facility] to [place] she stopped taking the psychiatric medication.  Patient admitted that she was feeling tired taking these medication and she was not happy that case she was given at [facility].  She was seeing multiple psychiatrist and she felt not consistent care.  Patient was also diagnosed with borderline personality disorder.  She has history of cutting in the past.  Her last cutting was 1 year ago when she cut superficially on her leg and scratch herself at the back.  Though she never require any stitches or medical attention with her cutting but admitted that cutting help her emotional pain.  Patient admitted history of severe mood swing, anger, mania, going  into severe depression and feeling isolated and withdrawn.  Her major stressors are being a single parent as her 59 year old daughter lives in [place] with her parents.  She is working at [employer] but job is stressful.  She feels some time lack of energy, lack of motivation, feeling hopeless helpless, anhedonia, irritability and crying spells.  She admitted poor attention and concentration.  She also feels some time paranoia but denies any hallucination, active or passive suicidal thoughts or homicidal thought.  She has not seen her individual therapist but scheduled to see Victorino Dike in 02-05-23.  Patient also endorsed smoking marijuana on a regular basis to calm herself.  Patient is open to go back on Lamictal.  Suicidal Ideation: No Plan Formed: No Patient has means to carry out plan: No  Homicidal Ideation: No Plan Formed: No Patient has means to carry out plan: No  Past Psychiatric History/Hospitalization(s): Patient is taking psychiatric medications since age 35.  She had tried Celexa, lithium, Seroquel, Haldol, trazodone and Lamictal.  She remember having a good response with Lamictal.  These prescriptions were given at [facility].  Patient has at least 2 psychiatric hospitalization.  In 2007-02-05 when her grandfather died and in Feb 05, 2012 her grandmother died and she could not handle the loss.  She was very depressed and having suicidal thoughts and she tried to cut her wrist in 02-05-12.  Recently she was given Ritalin to help her ADD symptoms.  Patient has never psychological testing.  She was diagnosed with borderline percent disorder.  She has history of cutting herself.  Anxiety: Yes Bipolar Disorder: Yes Depression: Yes Mania: Yes Psychosis: No Schizophrenia: No Personality Disorder: Yes, borderline personality disorder Hospitalization for psychiatric illness: Yes History of Electroconvulsive Shock Therapy: No Prior Suicide Attempts: Yes  Family History; Patient endorse father has drinking  problem.  Medical History; Patient is looking for primary care physician.  She denies any history of seizures, headaches or any chronic pain.  Traumatic brain injury: Patient denies any history of per management injury.  Education and Work History; Patient is working at [employer]  Psychosocial History; Patient born and raised in [place].  She has 65 year old daughter who lives there with her parents.  Patient currently living with her boyfriend and reported relationship is going very well.  Legal History; Patient denies any legal issues.  History Of Abuse; Patient denies any history of abuse.  Substance Abuse History; Patient admitted smoking marijuana.  Review of Systems: Psychiatric: Agitation: Irritability Hallucination: No Depressed Mood: Yes Insomnia: Yes Hypersomnia: Yes Altered Concentration: No Feels Worthless: Yes Grandiose Ideas: No Belief In Special Powers: No New/Increased Substance Abuse: Yes Compulsions: No  Neurologic: Headache: No Seizure: No Paresthesias: No   Outpatient Encounter Prescriptions as of 12/17/2015  Medication Sig  . lamoTRIgine (LAMICTAL) 25 MG tablet Take 1 tab daily for 1 week and than 2 tab daily  . [DISCONTINUED] amphetamine-dextroamphetamine (ADDERALL) 10 MG tablet Take 1 tablet (10 mg total) by mouth 2 (two) times daily with a meal.   No facility-administered encounter medications on file as of 12/17/2015.    No results found for this or any previous visit (from the past 2160 hour(s)).    Constitutional:  BP 140/82 mmHg  Pulse 74  Ht  (1.676 m)  Wt 189 lb 6.4 oz (85.911 kg)  BMI 30.58 kg/m2   Musculoskeletal: Strength & Muscle Tone: within normal limits Gait & Station: normal Patient leans: N/A  Psychiatric Specialty Exam: General Appearance: Casual  Eye Contact::  Fair  Speech:  Slow  Volume:  Normal  Mood:  Anxious, Depressed and Tearful  Affect:  Labile  Thought Process:  Coherent  Orientation:   Full (Time, Place, and Person)  Thought Content:  Rumination  Suicidal Thoughts:  No  Homicidal Thoughts:  No  Memory:  Immediate;   Fair Recent;   Fair Remote;   Fair  Judgement:  Fair  Insight:  Fair  Psychomotor Activity:  Decreased  Concentration:  Fair  Recall:  [employer] of Knowledge:  Fair  Language:  Fair  Akathisia:  No  Handed:  Right  AIMS (if indicated):     Assets:  Communication Skills Desire for Improvement  ADL's:  Intact  Cognition:  WNL  Sleep:        New problem, with additional work up planned, Review of Psycho-Social Stressors (1), Review or order clinical lab tests (1), Decision to obtain old records (1), Review and summation of old records (2), Established Problem, Worsening (2), Review of Medication Regimen & Side Effects (2) and Review of New Medication or Change in Dosage (2)  Assessment: Axis I: Bipolar disorder, depressed type.  Cannabis abuse.  Axis II: Rule out borderline personality disorder  Axis III:  Past Medical History  Diagnosis Date  . Medical history non-contributory   . Bipolar disorder (HCC)     diagnosed >2 yrs ago  . Depression     diagnosed >2 yrs ago     Plan:  Patient seen chart reviewed.  Discussed psychosocial stressors, current medication in detail.  Patient do  not see any improvement with Ritalin area she used to take Lamictal with good response.  I recommended to go back on Lamictal 25 mg daily and then 50 mg daily to help her mood symptoms.  Reinforce medication side effects especially rash occurs then she need to stop the medication immediately.  We will also get records from her previous psychiatrist.  Patient also scheduled to see primary care physician in few weeks and we will follow-up on blood work.  Patient also scheduled to see Boneta LucksJennifer Brown for individual counseling.  Recommended to call us back if she has any question, concern or worsening of the symptom.  We will discontinue Ritalin as it is not helping.  I  will see her again in 3 weeks.Time spent 55 minutes.  More than 50% of the time spent in psychoeducation, counseling and coordination of care.  Discuss safety plan that anytime having active suicidal thoughts or homicidal thoughts then patient need to call 911 or go to the local emergency room.    Virginia Ballard T., MD 12/17/2015

## 2015-12-19 LAB — CYTOLOGY - PAP

## 2015-12-30 ENCOUNTER — Ambulatory Visit (HOSPITAL_COMMUNITY): Payer: Self-pay | Admitting: Psychiatry

## 2016-01-07 ENCOUNTER — Ambulatory Visit (INDEPENDENT_AMBULATORY_CARE_PROVIDER_SITE_OTHER): Payer: PRIVATE HEALTH INSURANCE | Admitting: Psychiatry

## 2016-01-07 ENCOUNTER — Encounter (HOSPITAL_COMMUNITY): Payer: Self-pay | Admitting: Psychiatry

## 2016-01-07 VITALS — BP 110/66 | HR 94 | Ht 66.0 in | Wt 194.2 lb

## 2016-01-07 DIAGNOSIS — F319 Bipolar disorder, unspecified: Secondary | ICD-10-CM

## 2016-01-07 DIAGNOSIS — F1210 Cannabis abuse, uncomplicated: Secondary | ICD-10-CM | POA: Diagnosis not present

## 2016-01-07 MED ORDER — LAMOTRIGINE 100 MG PO TABS: 100.0000 mg | ORAL_TABLET | Freq: Every day | ORAL | Status: DC

## 2016-01-07 MED ORDER — QUETIAPINE FUMARATE 50 MG PO TABS: 50.0000 mg | ORAL_TABLET | Freq: Every day | ORAL | Status: DC

## 2016-01-07 NOTE — Progress Notes (Signed)
[facility] [id number] Progress Note  Virginia Ballard [id number] 35 y.o.  01/07/2016 1:58 PM  Chief Complaint:  I'm doing so-so.  I'm separated from my boyfriend.  I'm taking medication but I'm not sure if I'm feeling better.   History of Present Illness:  Virginia Ballard came for her follow-up appointment.  She is 35 year old African-American single employed female who is referred from intensive outpatient program.  Patient was seen in almost 3 weeks ago for the first time.  She was started on Lamictal and her Ritalin was discontinued.  She is tolerating Lamictal and reported no side effects including any rash, itching or any headaches.  However she still have symptoms of depression, irritability, crying spells and racing thoughts.  Her energy level remains low.  She was noticed easily tearful and emotional during the conversation.  She mentioned separated from her boyfriend and she is sad about it.  She mentioned that she's confuse about the separation as boyfriend did not elaborate the reason very well.  She was happy when her 65 year old daughter came to visit her with patient's parents and niece.  She had a good time with her daughter.  She scheduled to see Virginia Ballard in first week of 02-11-2023.  Patient is still endorse feeling of hopelessness, anhedonia and depression but denies any active suicidal thoughts.  She is not involved in any binge drinking or smoking but admitted drinking wine at bedtime.  She is also not involved in any self abusive behavior.  She continued to endorse feeling isolated, withdrawn and depressed.  She is requesting a letter so she can take break for 15 minutes after 4 hours.  Patient is working at [employer].  She was given the job accommodation letter while she was intensive outpatient program.  Patient has no tremors.  She sleeping better.  She is open to increase the dose of Lamictal and restart low-dose Seroquel which she has taken in the past.  Suicidal Ideation:  No Plan Formed: No Patient has means to carry out plan: No  Homicidal Ideation: No Plan Formed: No Patient has means to carry out plan: No  Past Psychiatric History/Hospitalization(s): Patient taking psychiatric medications since age 47.  She had tried Celexa, lithium, Seroquel, Haldol, trazodone and Lamictal.  She remember having a good response with Lamictal.  These [facility] were [facility] at [facility].  Patient has at least 2 psychiatric hospitalization.  In 2007/02/11 when her grandfather died and in 02-11-12 her grandmother died and she could not handle the loss.  She was very depressed and having suicidal thoughts and she tried to cut her wrist in 11-Feb-2012.  Patient was given Ritalin when she was in intensive outpatient program and generally 02/11/16.  She was diagnosed with ADD symptoms.  Patient has never psychological testing.  She was diagnosed with borderline percent disorder.  She has history of cutting herself. Anxiety: Yes Bipolar Disorder: Yes Depression: Yes Mania: Yes Psychosis: No Schizophrenia: No Personality Disorder: Yes, borderline personality disorder Hospitalization for psychiatric illness: Yes History of Electroconvulsive Shock Therapy: No Prior Suicide Attempts: Yes  Family History; Patient endorse father has drinking problem.  Medical History; Patient is looking for primary care physician.  She denies any history of seizures, headaches or any chronic pain.  She recently visited her primary care physician for urinary discharge however no medications were given.  Psychosocial History; Patient born and raised in [place].  She has 27 year old daughter who lives there with her parents.  Patient currently living with  her boyfriend and reported relationship is going very well.  Review of Systems: Psychiatric: Agitation: Irritability Hallucination: No Depressed Mood: Yes Insomnia: Yes Hypersomnia: Yes Altered Concentration: No Feels Worthless: Yes Grandiose Ideas:  No Belief In Special Powers: No New/Increased Substance Abuse: Yes Compulsions: No  Neurologic: Headache: No Seizure: No Paresthesias: No   Outpatient Encounter Prescriptions as of 01/07/2016  Medication Sig  . lamoTRIgine (LAMICTAL) 100 MG tablet Take 1 tablet (100 mg total) by mouth daily.  . [DISCONTINUED] lamoTRIgine (LAMICTAL) 25 MG tablet Take 1 tab daily for 1 week and than 2 tab daily  . QUEtiapine (SEROQUEL) 50 MG tablet Take 1 tablet (50 mg total) by mouth at bedtime.   No facility-administered encounter medications on file as of 01/07/2016.    Recent Results (from the past 2160 hour(s))  Cytology - PAP [place]     Status: None   Collection Time: 12/17/15 12:00 AM  Result Value Ref Range   CYTOLOGY - PAP PAP RESULT   POCT Wet Prep Mellody Drown(Wet [place])     Status: Abnormal   Collection Time: 12/17/15  4:57 PM  Result Value Ref Range   Source Wet Prep POC VAG    WBC, Wet Prep HPF POC >20    Bacteria Wet Prep HPF POC Moderate (A) None, Few, Too numerous to count   Clue Cells Wet Prep HPF POC None None, Too numerous to count   Clue Cells Wet Prep Whiff POC Positive Whiff    Yeast Wet Prep HPF POC None    Trichomonas Wet Prep HPF POC MANY   POCT urine pregnancy     Status: None   Collection Time: 12/17/15  4:57 PM  Result Value Ref Range   Preg Test, Ur Negative Negative      Constitutional:  BP 110/66 mmHg  Pulse 94  Ht 5\' 6"  (1.676 m)  Wt 194 lb 3.2 oz (88.089 kg)  BMI 31.36 kg/m2   Musculoskeletal: Strength & Muscle Tone: within normal limits Gait & Station: normal Patient leans: N/A  Psychiatric Specialty Exam: General Appearance: Casual  Eye Contact::  Fair  Speech:  Slow  Volume:  Normal  Mood:  Depressed and Tearful  Affect:  Labile  Thought Process:  Coherent  Orientation:  Full (Time, Place, and Person)  Thought Content:  Rumination  Suicidal Thoughts:  No  Homicidal Thoughts:  No  Memory:  Immediate;   Fair Recent;   Fair Remote;   Fair   Judgement:  Fair  Insight:  Fair  Psychomotor Activity:  Decreased  Concentration:  Fair  Recall:  [employer]  Fund of Knowledge:  Fair  Language:  Fair  Akathisia:  No  Handed:  Right  AIMS (if indicated):     Assets:  Communication Skills Desire for Improvement  ADL's:  Intact  Cognition:  WNL  Sleep:        Review of Psycho-Social Stressors (1), Review or order clinical lab tests (1), Decision to obtain old records (1), Established Problem, Worsening (2), Review of Last Therapy Session (1), Review of Medication Regimen & Side Effects (2) and Review of New Medication or Change in Dosage (2)  Assessment: Axis I: Bipolar disorder, depressed type.  Cannabis abuse.  Axis II: Rule out borderline personality disorder  Axis III:  Past Medical History  Diagnosis Date  . Medical history non-contributory   . Bipolar disorder (HCC)     diagnosed >2 yrs ago  . Depression     diagnosed >2 yrs ago  Plan:  I reviewed records and lab work from her primary care physician.  Discussed psychosocial stressors.  Increase Lamictal 100 mg daily and start Seroquel 50 mg at bedtime.  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  Discuss substance use and recommended to stop drinking and smoking marijuana.  Patient is scheduled to see Virginia Ballard in first week of May.  Recommended to call us back if she has any question or any concern.  Follow-up in 4 weeks. I would also sign a letter for job accommodation so she can take break every 4 hours for 15 minutes.  Time spent 25 minutes.  More than 50% of the time spent in psychoeducation, counseling and coordination of care.  Discuss safety plan that anytime having active suicidal thoughts or homicidal thoughts then patient need to call 911 or go to the local emergency room.    ARFEEN,SYED T., MD 01/07/2016

## 2016-01-12 ENCOUNTER — Encounter (HOSPITAL_COMMUNITY): Payer: Self-pay

## 2016-01-26 ENCOUNTER — Ambulatory Visit (INDEPENDENT_AMBULATORY_CARE_PROVIDER_SITE_OTHER): Payer: PRIVATE HEALTH INSURANCE | Admitting: Psychiatry

## 2016-01-26 DIAGNOSIS — F319 Bipolar disorder, unspecified: Secondary | ICD-10-CM

## 2016-01-29 NOTE — Progress Notes (Signed)
   THERAPIST PROGRESS NOTE  Session Time: 3:05-4:00  Participation Level: Active  Behavioral Response: CasualAlertEuthymic  Type of Therapy: Individual Therapy  Treatment Goals addressed: Anxiety and Depression  Interventions: CBT  Summary: Virginia Ballard is a 35 y.o. female who presents with anxiety and depression.   Suicidal/Homicidal: Nowithout intent/plan  Therapist Response: Pt. Presents for first individual session since discharge form mental health IOP. Pt. Presents as talkative, engaged in the therapy process. Pt. Does not report increase in anxiety or depression and no periods of mania. Pt. Reports that her new psychiatrist removed her from ADD medication and is treating her for bipolar. Pt. Shared that she was very hurt by recent break up with her boyfriend. Pt. Reports that she kept her housing and he moved out. The break up was about 6 weeks ago and Pt. Shared feelings that she "should be over it". Significant time was spent in the session processing normative time for grief and not rushing through the process. Pt. Also shared work situations that she tends to [employer] Groupoverinternalize and take personally.  Plan: Return again in 2 weeks.  Diagnosis: Axis I: Bipolar, mixed    Axis II: No diagnosis    Shaune PollackBrown, Izak Anding B, [facility] 01/29/2016

## 2016-02-03 ENCOUNTER — Other Ambulatory Visit (HOSPITAL_COMMUNITY): Payer: Self-pay | Admitting: Psychiatry

## 2016-02-06 ENCOUNTER — Telehealth (HOSPITAL_COMMUNITY): Payer: Self-pay

## 2016-02-06 NOTE — Telephone Encounter (Signed)
Spoke to Dr. Lolly MustacheArfeen and he gave the okay to send in order for patient meds, this was done.

## 2016-02-06 NOTE — Telephone Encounter (Signed)
Fax received from pharmacy, patient needs a refill on Seroquel and Lamotrigene - last visit was 4/19 and she has a follow up on 5/22, patient will be out of meds this weekend, please review and advise, thank you

## 2016-02-09 ENCOUNTER — Ambulatory Visit (HOSPITAL_COMMUNITY): Payer: Self-pay | Admitting: Psychiatry

## 2016-02-10 ENCOUNTER — Ambulatory Visit (INDEPENDENT_AMBULATORY_CARE_PROVIDER_SITE_OTHER): Payer: PRIVATE HEALTH INSURANCE | Admitting: Psychiatry

## 2016-02-10 DIAGNOSIS — F319 Bipolar disorder, unspecified: Secondary | ICD-10-CM

## 2016-02-11 NOTE — Progress Notes (Signed)
   THERAPIST PROGRESS NOTE   Session Time: 3:10-4:00  Participation Level: Active  Behavioral Response: CasualAlertEuthymic  Type of Therapy: Individual Therapy  Treatment Goals addressed: Anxiety and Depression  Interventions: CBT  Summary: Virginia Ballard is a 35 y.o. female who presents with anxiety and depression.   Suicidal/Homicidal: Nowithout intent/plan  Therapist Response: Pt. Presented as tearful and depressed. Pt. Reported that she has abstained from alcohol and cannabis since our last session. Pt. Also reported that she has run out of the stimulant prescribed to her during IOP. Pt.'s current mood was normalized given that she has not been using substances in last several weeks to numb emotional pain and has not been using coping skills to manage anger and sadness. Pt. 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 Pt. Discussed her discomfort with reaching out to her father and sister's for support and fear of appearing weak to them. Pt. Also discussed that she received bill for IOP and psychiatric services which indicated that services were not being covered. Pt. Was encouraged to call her HR and to follow up with [facility] billing department.  Plan: Return again in 2 weeks.  Diagnosis:Axis I: Bipolar, mixed  Axis II: No diagnosis   Virginia Ballard, Virginia Ballard, [facility] 02/11/2016

## 2016-02-12 ENCOUNTER — Encounter (HOSPITAL_COMMUNITY): Payer: Self-pay | Admitting: Psychiatry

## 2016-02-12 ENCOUNTER — Ambulatory Visit (INDEPENDENT_AMBULATORY_CARE_PROVIDER_SITE_OTHER): Payer: PRIVATE HEALTH INSURANCE | Admitting: Psychiatry

## 2016-02-12 VITALS — BP 140/94 | HR 100 | Ht 66.0 in | Wt 185.8 lb

## 2016-02-12 DIAGNOSIS — F319 Bipolar disorder, unspecified: Secondary | ICD-10-CM | POA: Diagnosis not present

## 2016-02-12 MED ORDER — QUETIAPINE FUMARATE 50 MG PO TABS: ORAL_TABLET | ORAL | Status: DC

## 2016-02-12 MED ORDER — LAMOTRIGINE 200 MG PO TABS: 200.0000 mg | ORAL_TABLET | Freq: Every day | ORAL | Status: DC

## 2016-02-12 NOTE — Progress Notes (Signed)
[facility] [address] Progress Note  Virginia Ballard [id number] 35 y.o.  02/12/2016 3:41 PM  Chief Complaint:  I like Lamictal.  Some days I take 1-1/2 tablet to help my anxiety.  I still have irritability and crying spells.     History of Present Illness:  Virginia Ballard came for her follow-up appointment.  She continued to endorse irritability , crying spells and depression.  However she admitted less outburst and anger issues.  She admitted taking Lamictal some days 1-1/2 tablet because she feel Lamictal helps.  She has no rash or itching.  She sleeping with Seroquel but she also takes melatonin at night.  She is concerned about her job because lately she is not getting full hours and leaving early .  She is not allowed to do 00 time during the weekdays and sometimes she has to go in the weekends to fill her hours.  She feels proud that she cut down significantly her marijuana use.  She has noticed people around her have a different attitude.  She is able to carry conversation without any severe anger outbursts.  She still have crying spells but they're less intense and less frequent.  She is not sure why she does cry on small things.  She is not involved in any self abusive behavior, cutting or scratching herself.  She started seeing Virginia Ballard and that helps a lot.  She is active and social.  Her appetite is fair.  She lost weight and she is happy about that.  She denies any tremors, shakes, rash or any itching.  She denies any hallucination or any suicidal thoughts.  Her vitals are stable.  She is working at [employer] and her job is stressful.  Suicidal Ideation: No Plan Formed: No Patient has means to carry out plan: No  Homicidal Ideation: No Plan Formed: No Patient has means to carry out plan: No  Past Psychiatric History/Hospitalization(s): Patient taking psychiatric medications since age 54.  She had tried Celexa, lithium, Seroquel, Haldol, trazodone and Lamictal.  She remember  having a good response with Lamictal.  These prescriptions were given at [facility].  Patient has at least 2 psychiatric hospitalization.  In Feb 25, 2007 when her grandfather died and in 25-Feb-2012 her grandmother died and she could not handle the loss.  She was very depressed and having suicidal thoughts and she tried to cut her wrist in 02/25/2012.  Patient was given Ritalin when she was in intensive outpatient program in January 2017.  She was diagnosed with ADD symptoms.  Patient has never psychological testing.  She was diagnosed with borderline percent disorder.  She has history of cutting herself. Anxiety: Yes Bipolar Disorder: Yes Depression: Yes Mania: Yes Psychosis: No Schizophrenia: No Personality Disorder: Yes, borderline personality disorder Hospitalization for psychiatric illness: Yes History of Electroconvulsive Shock Therapy: No Prior Suicide Attempts: Yes  Family History; Patient endorse father has drinking problem.  Medical History; Patient is looking for primary care physician.  She denies any history of seizures, headaches or any chronic pain.  She recently visited her primary care physician for urinary discharge however no medications were given.  Psychosocial History; Patient born and raised in [place].  She has 14 year old daughter who lives there with her parents.  Patient currently living with her boyfriend and reported relationship is going very well.  Review of Systems: Psychiatric: Agitation: Irritability Hallucination: No Depressed Mood: Yes Insomnia: No Hypersomnia: No Altered Concentration: No Feels Worthless: No Grandiose Ideas: No Belief In Special Powers:  No New/Increased Substance Abuse: No Compulsions: No  Neurologic: Headache: No Seizure: No Paresthesias: No   Outpatient Encounter Prescriptions as of 02/12/2016  Medication Sig  . lamoTRIgine (LAMICTAL) 200 MG tablet Take 1 tablet (200 mg total) by mouth daily.  . QUEtiapine (SEROQUEL) 50 MG  tablet TAKE 1 TABLET (50 MG TOTAL) BY MOUTH AT BEDTIME.  . [DISCONTINUED] lamoTRIgine (LAMICTAL) 100 MG tablet TAKE 1 TABLET (100 MG TOTAL) BY MOUTH DAILY.  . [DISCONTINUED] QUEtiapine (SEROQUEL) 50 MG tablet TAKE 1 TABLET (50 MG TOTAL) BY MOUTH AT BEDTIME.   No facility-administered encounter medications on file as of 02/12/2016.    Recent Results (from the past 2160 hour(s))  Cytology - PAP [place]     Status: None   Collection Time: 12/17/15 12:00 AM  Result Value Ref Range   CYTOLOGY - PAP PAP RESULT   POCT Wet Prep Mellody Ballard([place]et [place])     Status: Abnormal   Collection Time: 12/17/15  4:57 PM  Result Value Ref Range   Source Wet Prep POC VAG    WBC, Wet Prep HPF POC >20    Bacteria Wet Prep HPF POC Moderate (A) None, Few, Too numerous to count   Clue Cells Wet Prep HPF POC None None, Too numerous to count   Clue Cells Wet Prep Whiff POC Positive Whiff    Yeast Wet Prep HPF POC None    Trichomonas Wet Prep HPF POC MANY   POCT urine pregnancy     Status: None   Collection Time: 12/17/15  4:57 PM  Result Value Ref Range   Preg Test, Ur Negative Negative      Constitutional:  BP 140/94 mmHg  Pulse 100  Ht 5\' 6"  (1.676 m)  Wt 185 lb 12.8 oz (84.278 kg)  BMI 30.00 kg/m2   Musculoskeletal: Strength & Muscle Tone: within normal limits Gait & Station: normal Patient leans: N/A  Psychiatric Specialty Exam: General Appearance: Casual  Eye Contact::  Fair  Speech:  Slow  Volume:  Normal  Mood:  Depressed and Tearful  Affect:  Labile  Thought Process:  Coherent  Orientation:  Full (Time, Place, and Person)  Thought Content:  Rumination  Suicidal Thoughts:  No  Homicidal Thoughts:  No  Memory:  Immediate;   Fair Recent;   Fair Remote;   Fair  Judgement:  Fair  Insight:  Fair  Psychomotor Activity:  Decreased  Concentration:  Fair  Recall:  FiservFair  Fund of Knowledge:  Fair  Language:  Fair  Akathisia:  No  Handed:  Right  AIMS (if indicated):     Assets:   Communication Skills Desire for Improvement  ADL's:  Intact  Cognition:  WNL  Sleep:        Established Problem, Stable/Improving (1), Review of Psycho-Social Stressors (1), Decision to obtain old records (1), Review of Last Therapy Session (1), Review of Medication Regimen & Side Effects (2) and Review of New Medication or Change in Dosage (2)  Assessment: Axis I: Bipolar disorder, depressed type.  Cannabis abuse.  Axis II: Rule out borderline personality disorder  Axis III:  Past Medical History  Diagnosis Date  . Medical history non-contributory   . Bipolar disorder (HCC)     diagnosed >2 yrs ago  . Depression     diagnosed >2 yrs ago     Plan:  I reviewed records andCurrent medication.  Patient is still has a little symptoms of irritability, mania and depression.  She is tolerating Lamictal without any  side effects.  I recommended to try Lamictal 200 mg daily and Seroquel 50 mg at bedtime.  She is taking melatonin to help her sleep.  She has cut down her marijuana use.  Reinforce to keep appointment with Boneta Lucks for counseling. Recommended to call us back if she has any question or any concern.  Follow-up in 8 weeks. Discuss safety plan that anytime having active suicidal thoughts or homicidal thoughts then patient need to call 911 or go to the local emergency room.  Kemaya Dorner T., MD 02/12/2016

## 2016-02-24 ENCOUNTER — Ambulatory Visit (INDEPENDENT_AMBULATORY_CARE_PROVIDER_SITE_OTHER): Payer: No Typology Code available for payment source | Admitting: Internal Medicine

## 2016-02-24 ENCOUNTER — Encounter: Payer: Self-pay | Admitting: Internal Medicine

## 2016-02-24 VITALS — BP 131/82 | HR 99 | Temp 98.2°F | Ht 62.0 in | Wt 192.2 lb

## 2016-02-24 DIAGNOSIS — Z30430 Encounter for insertion of intrauterine contraceptive device: Secondary | ICD-10-CM

## 2016-02-24 DIAGNOSIS — Z30433 Encounter for removal and reinsertion of intrauterine contraceptive device: Secondary | ICD-10-CM | POA: Diagnosis not present

## 2016-02-24 LAB — POCT URINE PREGNANCY: Preg Test, Ur: NEGATIVE

## 2016-02-24 MED ORDER — LEVONORGESTREL 20 MCG/24HR IU IUD
INTRAUTERINE_SYSTEM | Freq: Once | INTRAUTERINE | Status: AC
  Administered 2016-02-24: 1 via INTRAUTERINE

## 2016-02-24 NOTE — Progress Notes (Deleted)
   Subjective:    Patient ID: Virginia Bullocksonya S Ballard, female    DOB: 11-23-1980, 35 y.o.   MRN: [id number]  HPI    Review of Systems     Objective:   Physical Exam        Assessment & Plan:  No problem-specific assessment & plan notes found for this encounter.  Tarri AbernethyAbigail J Lancaster, MD PGY-1 [facility] Pager [phone]

## 2016-02-24 NOTE — Patient Instructions (Addendum)
It was nice seeing you again today Virginia Ballard!  We removed your former Mirena and inserted a new one today. You will need this Mirena removed in June 2022.   I would like to see you back in about a month (or after your next menstrual period) to make sure you are not having any complications, and to trim the Mirena strings shorter if necessary.   You can take  ibuprofen every 4-6 hours as needed for pain. You can also use a heating pad. If you are having severe pain or cramping or a lot of bleeding, please call the office to let us know.   If you have any questions or concerns, feel free to call the office at any time.    Be well,  Dr. Natale Milch  Levonorgestrel intrauterine device (IUD) What is this medicine? LEVONORGESTREL IUD (LEE voe nor jes trel) is a contraceptive (birth control) device. The device is placed inside the uterus by a healthcare professional. It is used to prevent pregnancy and can also be used to treat heavy bleeding that occurs during your period. Depending on the device, it can be used for 3 to 5 years. This medicine may be used for other purposes; ask your health care provider or pharmacist if you have questions. What should I tell my health care provider before I take this medicine? They need to know if you have any of these conditions: -abnormal Pap smear -cancer of the breast, uterus, or cervix -diabetes -endometritis -genital or pelvic infection now or in the past -have more than one sexual partner or your partner has more than one partner -heart disease -history of an ectopic or tubal pregnancy -immune system problems -IUD in place -liver disease or tumor -problems with blood clots or take blood-thinners -use intravenous drugs -uterus of unusual shape -vaginal bleeding that has not been explained -an unusual or allergic reaction to levonorgestrel, other hormones, silicone, or polyethylene, medicines, foods, dyes, or preservatives -pregnant or trying  to get pregnant -breast-feeding How should I use this medicine? This device is placed inside the uterus by a health care professional. Talk to your pediatrician regarding the use of this medicine in children. Special care may be needed. Overdosage: If you think you have taken too much of this medicine contact a poison control center or emergency room at once. NOTE: This medicine is only for you. Do not share this medicine with others. What if I miss a dose? This does not apply. What may interact with this medicine? Do not take this medicine with any of the following medications: -amprenavir -bosentan -fosamprenavir This medicine may also interact with the following medications: -aprepitant -barbiturate medicines for inducing sleep or treating seizures -bexarotene -griseofulvin -medicines to treat seizures like carbamazepine, ethotoin, felbamate, oxcarbazepine, phenytoin, topiramate -modafinil -pioglitazone -rifabutin -rifampin -rifapentine -some medicines to treat HIV infection like atazanavir, indinavir, lopinavir, nelfinavir, tipranavir, ritonavir -St. John's wort -warfarin This list may not describe all possible interactions. Give your health care provider a list of all the medicines, herbs, non-prescription drugs, or dietary supplements you use. Also tell them if you smoke, drink alcohol, or use illegal drugs. Some items may interact with your medicine. What should I watch for while using this medicine? Visit your doctor or health care professional for regular check ups. See your doctor if you or your partner has sexual contact with others, becomes HIV positive, or gets a sexual transmitted disease. This product does not protect you against HIV infection (AIDS) or other sexually transmitted  diseases. You can check the placement of the IUD yourself by reaching up to the top of your vagina with clean fingers to feel the threads. Do not pull on the threads. It is a good habit to  check placement after each menstrual period. Call your doctor right away if you feel more of the IUD than just the threads or if you cannot feel the threads at all. The IUD may come out by itself. You may become pregnant if the device comes out. If you notice that the IUD has come out use a backup birth control method like condoms and call your health care provider. Using tampons will not change the position of the IUD and are okay to use during your period. What side effects may I notice from receiving this medicine? Side effects that you should report to your doctor or health care professional as soon as possible: -allergic reactions like skin rash, itching or hives, swelling of the face, lips, or tongue -fever, flu-like symptoms -genital sores -high blood pressure -no menstrual period for 6 weeks during use -pain, swelling, warmth in the leg -pelvic pain or tenderness -severe or sudden headache -signs of pregnancy -stomach cramping -sudden shortness of breath -trouble with balance, talking, or walking -unusual vaginal bleeding, discharge -yellowing of the eyes or skin Side effects that usually do not require medical attention (report to your doctor or health care professional if they continue or are bothersome): -acne -breast pain -change in sex drive or performance -changes in weight -cramping, dizziness, or faintness while the device is being inserted -headache -irregular menstrual bleeding within first 3 to 6 months of use -nausea This list may not describe all possible side effects. Call your doctor for medical advice about side effects. You may report side effects to FDA at 1-800-FDA-1088. Where should I keep my medicine? This does not apply. NOTE: This sheet is a summary. It may not cover all possible information. If you have questions about this medicine, talk to your doctor, pharmacist, or health care provider.    2016, Elsevier/Gold Standard. (2011-10-07 13:54:04)

## 2016-02-24 NOTE — Progress Notes (Signed)
Patient ID: Virginia Ballard, female   DOB: Apr 05, 1981, 35 y.o.   MRN: [id number]  PROCEDURE NOTE: IUD REMOVAL AND RE-INSERTION  IUD REMOVAL: Patient given informed consent for IUD removal and re-insertion of new IUD. Signed copy in the chart. Appropriate time out taken. Sterile technique used. Patient placed in the lithotomy position and the cervix brought into view using speculum. The IUD strings were identified coming from the cervical os. These strings were grasped with ring forceps, and the IUD withdrawn gently from the uterus. There were no complications and no blood loss.    IUD RE-INSERTION: Sterile technique maintained. Cervix cleansed three times with 3 betadine swabs. Uterine sound was used to measure uterine size to 6.5 centimeters. A Mirena IUD was placed into the endometrial cavity, deployed and secured. The applicator was removed. The strings were trimmed to 2 centimeters.  All equipment was removed and accounted for.There were no complications and the patient tolerated the procedure well.   She was given handouts for post procedure instructions and information about the IUD including a card with the time of recommended removal. Follow-up in four weeks with trimming of strings if necessary at that time.   Tarri AbernethyAbigail J Freddrick Gladson, MD PGY-1 [facility] Pager [phone]

## 2016-02-24 NOTE — Addendum Note (Signed)
Addended by: Jone BasemanFLEEGER, Japji Kok D on: 02/24/2016 05:42 PM   Modules accepted: Orders

## 2016-02-24 NOTE — Assessment & Plan Note (Signed)
[place] [facility] [place] removed without complications. Mirena IUD inserted without complications. New Mirena to be removed in 02/2021. Patient to follow-up in four weeks with strings trimmed at that time if necessary.

## 2016-03-25 ENCOUNTER — Ambulatory Visit (INDEPENDENT_AMBULATORY_CARE_PROVIDER_SITE_OTHER): Payer: No Typology Code available for payment source | Admitting: Internal Medicine

## 2016-03-25 ENCOUNTER — Encounter: Payer: Self-pay | Admitting: Internal Medicine

## 2016-03-25 VITALS — BP 128/81 | HR 110 | Temp 98.3°F | Ht 62.0 in | Wt 193.8 lb

## 2016-03-25 DIAGNOSIS — R10A1 Flank pain, right side: Secondary | ICD-10-CM | POA: Insufficient documentation

## 2016-03-25 DIAGNOSIS — R1011 Right upper quadrant pain: Secondary | ICD-10-CM | POA: Diagnosis not present

## 2016-03-25 DIAGNOSIS — Z30431 Encounter for routine checking of intrauterine contraceptive device: Secondary | ICD-10-CM

## 2016-03-25 DIAGNOSIS — R109 Unspecified abdominal pain: Secondary | ICD-10-CM | POA: Insufficient documentation

## 2016-03-25 NOTE — Progress Notes (Signed)
   Subjective:    Patient ID: Virginia Ballard, female    DOB: December 26, 1980, 35 y.o.   MRN: [id number]  HPI  Patient presents for IUD string check and with R flank pain.  IUD check up Patient had IUD placed approximately one month ago. Denies any issues since. No vaginal bleeding or unusual discharge. Has not had irritation 2/2 strings. No abdominal pain.   Flank pain Patient reporting pain in her R flank for the past few days. Says it is most prominent in the morning when she gets out of bed, and if she stretches. Has not had a recent increase in physical activity. 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 Has not taken anything to help with the pain. Does not want to start any type of medication today, but wanted to make sure it was not anything especially concerning.   Patient is non-smoker.   Review of Systems See HPI.     Objective:   Physical Exam  Constitutional: She is oriented to person, place, and time. She appears well-developed and well-nourished. No distress.  HENT:  Head: Normocephalic and atraumatic.  Genitourinary: Vagina normal. No vaginal discharge found.  IUD strings visualized  Musculoskeletal:  Mild TTP of R flank. Full ROM. No gait abnormalities.   Neurological: She is alert and oriented to person, place, and time.  Psychiatric: She has a normal mood and affect. Her behavior is normal.      Assessment & Plan:  IUD check up No complaints. Strings visualized and in appropriate location on speculum exam. Denies feeling strings, so will not trim today.   Acute right flank pain From patient's description of pain and tenderness to palpation on physical exam, most likely MSK etiology. Patient does not wish to try any medication today, and would prefer instead to continue to monitor the pain and return if it persists or worsens.    Tarri AbernethyAbigail J Subhan Hoopes, MD, MPH PGY-2 [facility] Pager  [phone]

## 2016-03-25 NOTE — Assessment & Plan Note (Signed)
No complaints. Strings visualized and in appropriate location on speculum exam. Denies feeling strings, so will not trim today.

## 2016-03-25 NOTE — Patient Instructions (Signed)
It was nice seeing you again today Ms. Wysocki!  There appear to be no problems with your IUD today.   We will see you back at your earliest convenience to discuss excess hair growth.   If you have any questions or concerns, please feel free to call the clinic.   Be well,  Dr. Natale MilchL[place]

## 2016-03-25 NOTE — Assessment & Plan Note (Signed)
From patient's description of pain and tenderness to palpation on physical exam, most likely MSK etiology. Patient does not wish to try any medication today, and would prefer instead to continue to monitor the pain and return if it persists or worsens.

## 2016-04-08 ENCOUNTER — Other Ambulatory Visit (HOSPITAL_COMMUNITY): Payer: Self-pay | Admitting: Psychiatry

## 2016-04-13 ENCOUNTER — Ambulatory Visit (INDEPENDENT_AMBULATORY_CARE_PROVIDER_SITE_OTHER): Payer: PRIVATE HEALTH INSURANCE | Admitting: Psychiatry

## 2016-04-13 ENCOUNTER — Encounter (HOSPITAL_COMMUNITY): Payer: Self-pay

## 2016-04-13 VITALS — BP 122/80 | HR 114 | Ht 66.0 in | Wt 193.8 lb

## 2016-04-13 DIAGNOSIS — F3162 Bipolar disorder, current episode mixed, moderate: Secondary | ICD-10-CM | POA: Diagnosis not present

## 2016-04-13 NOTE — Progress Notes (Signed)
BH MD/PA/NP OP Progress Note  04/13/2016 2:33 PM Virginia Ballard  MRN:  [id number]  Chief Complaint: depresion Subjective:  Says she is sad, crying daily, frustrated by her job requirements HPI:  Virginia Ballard is having a hard time working 2 jobs because the hours do not correspond to taking the Seroquel.  Th Seroquel helps but it makes her sleepy so she cannot take it when she works.  The work situation creates a problem itself in that she can only work 6 hours a day otherwise her moods take over and she decompensates into tears, irritability to the point she is afraid she will lose control and has to leave.  She does not get enough sleep which makes everything worse.. Sometimes one job starts 1 hr after the other ends and it is extremely stressful [place] from one town to the other just barely making it.  One job has a new point system and she has accumulated 7 points already with 8 being termination status.  She is falling apart with the stress and is requesting accommodations to help as she says she is otherwise a good employee. Visit Diagnosis: No diagnosis found.  Past Psychiatric History: no changes  Past Medical History:  Past Medical History:  Diagnosis Date  . Bipolar disorder (HCC)    diagnosed >2 yrs ago  . Depression    diagnosed >2 yrs ago  . Medical history non-contributory     Past Surgical History:  Procedure Laterality Date  . NO PAST SURGERIES      Family Psychiatric History: no changes  Family History:  Family History  Problem Relation Age of Onset  . Alcohol abuse Father     Social History:  Social History   Social History  . Marital status: Married    Spouse name: N/A  . Number of children: N/A  . Years of education: N/A   Social History Main Topics  . Smoking status: Current Every Day Smoker    Packs/day: 0.50    Types: Cigarettes  . Smokeless tobacco: Not on file  . Alcohol use 2.4 oz/week    4 Glasses of wine per week  .  Drug use:     Types: Marijuana     Comment: one blunt daily (THC)  . Sexual activity: Yes    Birth control/ protection: IUD   Other Topics Concern  . Not on file   Social History Narrative  . No narrative on file    Allergies: No Known Allergies  Metabolic Disorder Labs: No results found for: HGBA1C, MPG No results found for: PROLACTIN No results found for: CHOL, TRIG, HDL, CHOLHDL, VLDL, LDLCALC   Current Medications: Current Outpatient Prescriptions  Medication Sig Dispense Refill  . risperiDONE (RISPERDAL) 0.5 MG tablet Take 0.5 mg by mouth at bedtime.    . lamoTRIgine (LAMICTAL) 200 MG tablet TAKE 1 TABLET (200 MG TOTAL) BY MOUTH DAILY. 30 tablet 1  . QUEtiapine (SEROQUEL) 50 MG tablet TAKE 1 TABLET (50 MG TOTAL) BY MOUTH AT BEDTIME. 30 tablet 1   No current facility-administered medications for this visit.     Neurologic: Headache: Negative Seizure: Negative Paresthesias: Negative  Musculoskeletal: Strength & Muscle Tone: within normal limits Gait & Station: normal Patient leans: N/A  Psychiatric Specialty Exam: ROS  Blood pressure 122/80, pulse (!) 114, height  (1.676 m), weight 193 lb 12.8 oz (87.9 kg).Body mass index is 31.28 kg/m.  General Appearance: Fairly Groomed  Eye Contact:  Good  Speech:  Clear and Coherent  Volume:  Normal  Mood:  Anxious and Depressed  Affect:  Congruent  Thought Process:  Coherent  Orientation:  Full (Time, Place, and Person)  Thought Content: Logical   Suicidal Thoughts:  No  Homicidal Thoughts:  No  Memory:  Immediate;   Good Recent;   Good Remote;   Good  Judgement:  Intact  Insight:  Fair  Psychomotor Activity:  Normal  Concentration:  Concentration: Good and Attention Span: Good  Recall:  Good  Fund of Knowledge: Good  Language: Good  Akathisia:  Negative  Handed:  Right  AIMS (if indicated):  0  Assets:  Communication Skills Desire for Improvement Financial Resources/Insurance Housing Physical  Health Resilience Talents/Skills Transportation Vocational/Educational  ADL's:  Intact  Cognition: WNL  Sleep:  poor     Treatment Plan Summary:   Depression:  Continue current meds Mood disorder:  Switch to risperisone 0.5 mg hs to see if less sedating. Stress:  FMLA updated with request to limit one job to 6 hours daily so that she can sleep with the benefit of medication   Carolanne Grumbling, MD 04/13/2016, 2:33 PM

## 2016-04-14 ENCOUNTER — Ambulatory Visit (HOSPITAL_COMMUNITY): Payer: Self-pay | Admitting: Psychiatry

## 2016-04-23 ENCOUNTER — Ambulatory Visit: Payer: Self-pay | Admitting: Internal Medicine

## 2016-05-04 ENCOUNTER — Ambulatory Visit (INDEPENDENT_AMBULATORY_CARE_PROVIDER_SITE_OTHER): Payer: PRIVATE HEALTH INSURANCE | Admitting: Internal Medicine

## 2016-05-04 ENCOUNTER — Encounter: Payer: Self-pay | Admitting: Internal Medicine

## 2016-05-04 DIAGNOSIS — L680 Hirsutism: Secondary | ICD-10-CM

## 2016-05-04 MED ORDER — SPIRONOLACTONE 50 MG PO TABS: 50.0000 mg | ORAL_TABLET | Freq: Two times a day (BID) | ORAL | 1 refills | Status: DC

## 2016-05-04 NOTE — Patient Instructions (Addendum)
It was nice seeing you again today Virginia Ballard!  Unfortunately the only way to get rid of existing hair is by laser hair removal. We can decrease the amount of future hair growth by starting a medication called spironolactone. Begin by taking 50 mg (one tablet) two times a day. We can increase the amount if needed at your follow-up appointment. You may experience some changes in your menstrual bleeding and possible upset stomach, but these symptoms should improve.   I will see you back in six weeks to see if your symptoms have improved.   If you have any questions or concerns, please feel free to call the clinic.   Be well,  Dr. Natale MilchLancaster

## 2016-05-04 NOTE — Assessment & Plan Note (Signed)
Likely 2/2 PCOS. Failed hormonal treatment alone (OCPs and Mirena). Discussed with patient that only laser hair removal can remove hair that is currently present, but beginning spironolactone may decrease future hair growth.  - Begin spironolactone 50mg  BID. May increase to 100mg  BID at f/u appointment if necessary.

## 2016-05-04 NOTE — Progress Notes (Signed)
   Subjective:    Patient ID: Virginia Ballard, female    DOB: [birth year], 35 y.o.   MRN: [id number]  HPI  Patient presents to discuss hirsutism.   Hirsutism Patient with known diagnosis of PCOS and hirsutism since puberty. Symptoms not worsening recently, but patient is becoming more embarrassed. Currently reporting hair most troublesome on face, but also with hair on chest, upper back, and upper lip. Has tried a prescription hair removal cream in the past, but reports that this burned her skin. Has not tried any over the counter hair removal or bleaching creams. Also tried OCPs in the past with no improvement. Currently has Mirena but has not noticed a difference. Shaves excess hair, but feels that the stubble is still very obvious.   Review of Systems See HPI.     Objective:   Physical Exam  Constitutional: She is oriented to person, place, and time. She appears well-developed and well-nourished. No distress.  Overweight  HENT:  Head: Normocephalic and atraumatic.  Pulmonary/Chest: Effort normal. No respiratory distress.  Neurological: She is alert and oriented to person, place, and time.  Skin: Skin is warm and dry.  Dark facial hair present on neck, under chin, and on upper lip. Small amount of coarse dark hair present on chest and upper back.   Psychiatric: She has a normal mood and affect. Her behavior is normal.      Assessment & Plan:  HIRSUTISM Likely 2/2 PCOS. Failed hormonal treatment alone (OCPs and Mirena). Discussed with patient that only laser hair removal can remove hair that is currently present, but beginning spironolactone may decrease future hair growth.  - Begin spironolactone 50mg  BID. May increase to 100mg  BID at f/u appointment if necessary.  Tarri AbernethyAbigail J Remigio Mcmillon, MD, MPH PGY-2 [facility] Pager [phone]\

## 2016-06-10 ENCOUNTER — Ambulatory Visit (INDEPENDENT_AMBULATORY_CARE_PROVIDER_SITE_OTHER): Payer: PRIVATE HEALTH INSURANCE | Admitting: Psychiatry

## 2016-06-10 ENCOUNTER — Encounter (HOSPITAL_COMMUNITY): Payer: Self-pay | Admitting: Psychiatry

## 2016-06-10 VITALS — BP 128/80 | HR 97 | Ht 66.0 in | Wt 198.8 lb

## 2016-06-10 DIAGNOSIS — F3131 Bipolar disorder, current episode depressed, mild: Secondary | ICD-10-CM

## 2016-06-10 MED ORDER — RISPERIDONE 1 MG PO TABS: 1.0000 mg | ORAL_TABLET | Freq: Two times a day (BID) | ORAL | 2 refills | Status: DC

## 2016-06-10 MED ORDER — LAMOTRIGINE 200 MG PO TABS: ORAL_TABLET | ORAL | 2 refills | Status: DC

## 2016-06-10 NOTE — Progress Notes (Signed)
[facility] [id number] Progress Note  Virginia Ballard [id number] 35 y.o.  06/10/2016 1:51 PM  Chief Complaint:  I'm taking Risperdal because Seroquel was too strong.  Sometime I have difficulty sleeping.       History of Present Illness:  Virginia Ballard came for her follow-up appointment.  She was seen by Dr. Ladona Ridgelaylor in my absence as she was feeling very sad depressed and tearful.  She was holding 2 jobs and very stressful.  She mentioned Seroquel was very sedated and it was switched to Risperdal.  She is working only 1 job at [employer] .  She mentioned she is actively looking for a second job because some time her current job does not have enough hours.    She admitted job still gets sometimes stressful.  Sometimes she has difficulty falling asleep.  She is taking Lamictal and reported no side effects.  She really feels that her anger, irritability and crying spells are less intense and less frequent.  Her paranoia is also improved from the past.  She is no longer having any hallucination .  She denies any outbursts of anger.  She denies any abusive behavior.  She feels proud that she has been not smoking marijuana since the last visit and even though she had a beer in her fridge she has not touch the bottle.  Her appetite is okay.  Her vital signs are stable.  She was seeing therapist Victorino DikeJennifer but lately due to financial reasons she has been not seeing any counselor.  She wants to continue Lamictal but wondering if something can be done to help her sleep.  Suicidal Ideation: No Plan Formed: No Patient has means to carry out plan: No  Homicidal Ideation: No Plan Formed: No Patient has means to carry out plan: No  Past Psychiatric History/Hospitalization(s): Patient taking psychiatric medications since age 35.  She had tried Celexa, lithium, Seroquel, Haldol, trazodone and Lamictal.  She remember having a good response with Lamictal.  [facility]  Patient has at  least 2 psychiatric hospitalization.  In 2008 when her grandfather died and in 2013 her grandmother died and she could not handle the loss.  She was very depressed and having suicidal thoughts and she tried to cut her wrist in 2013.  Patient was given Ritalin when she was in intensive outpatient program in January 2017.  She was diagnosed with ADD symptoms.  Patient has never psychological testing.  She was diagnosed with borderline percent disorder.  She has history of cutting herself. Anxiety: Yes Bipolar Disorder: Yes Depression: Yes Mania: Yes Psychosis: No Schizophrenia: No Personality Disorder: Yes, borderline personality disorder Hospitalization for psychiatric illness: Yes History of Electroconvulsive Shock Therapy: No Prior Suicide Attempts: Yes  Family History; Patient endorse father has drinking problem.  Medical History; She see Dr. Cammy CopaAbigail .  She denies any history of seizures, headaches or any chronic pain.  She recently visited her primary care physician for histutism.   Psychosocial History; [place]  She has 36 year old daughter who lives there with her parents.  Patient currently living with her boyfriend and reported relationship is going very well.  Review of Systems: Psychiatric: Agitation: No Hallucination: No Depressed Mood: Yes Insomnia: Yes Hypersomnia: No Altered Concentration: No Feels Worthless: No Grandiose Ideas: No Belief In Special Powers: No New/Increased Substance Abuse: No Compulsions: No  Neurologic: Headache: No Seizure: No Paresthesias: No   Outpatient Encounter Prescriptions as of 06/10/2016  Medication  Sig  . lamoTRIgine (LAMICTAL) 200 MG tablet TAKE 1 TABLET (200 MG TOTAL) BY MOUTH DAILY.  Virginia Ballard risperiDONE (RISPERDAL) 0.5 MG tablet Take 0.5 mg by mouth at bedtime.  Virginia Ballard spironolactone (ALDACTONE) 50 MG tablet Take 1 tablet (50 mg total) by mouth 2 (two) times daily.  . [DISCONTINUED] QUEtiapine  (SEROQUEL) 50 MG tablet TAKE 1 TABLET (50 MG TOTAL) BY MOUTH AT BEDTIME.   No facility-administered encounter medications on file as of 06/10/2016.     No results found for this or any previous visit (from the past 2160 hour(s)).    Constitutional:  BP 128/80   Pulse 97   Ht 5\' 6"  (1.676 m)   Wt 198 lb 12.8 oz (90.2 kg)   BMI 32.09 kg/m    Musculoskeletal: Strength & Muscle Tone: within normal limits Gait & Station: normal Patient leans: N/A  Psychiatric Specialty Exam: General Appearance: Casual  Eye Contact::  Fair  Speech:  Slow  Volume:  Normal  Mood:  Anxious  Affect:  Labile  Thought Process:  Coherent  Orientation:  Full (Time, Place, and Person)  Thought Content:  Rumination  Suicidal Thoughts:  No  Homicidal Thoughts:  No  Memory:  Immediate;   Fair Recent;   Fair Remote;   Fair  Judgement:  Fair  Insight:  Fair  Psychomotor Activity:  Normal  Concentration:  Fair  Recall:  [employer] of Knowledge:  Fair  Language:  Fair  Akathisia:  No  Handed:  Right  AIMS (if indicated):     Assets:  Communication Skills Desire for Improvement  ADL's:  Intact  Cognition:  WNL  Sleep:        Established Problem, Stable/Improving (1), Review of Psycho-Social Stressors (1), Review or order clinical lab tests (1), Decision to obtain old records (1), Review and summation of old records (2), Review of Last Therapy Session (1), Review of Medication Regimen & Side Effects (2) and Review of New Medication or Change in Dosage (2)  Assessment: Axis I: Bipolar disorder, depressed type.  Cannabis abuse.  Axis II: Rule out borderline personality disorder  Axis III:  Past Medical History:  Diagnosis Date  . Bipolar disorder (HCC)    diagnosed >2 yrs ago  . Depression    diagnosed >2 yrs ago  . Medical history non-contributory      Plan:  I reviewed records From previous psychiatrist and primary care physician.  She is taking Risperdal 0.5 mg at bedtime and  Lamictal 200 mg daily.  Some nights she takes Seroquel when she does require a good night sleep.  I recommended to discontinue Seroquel and try Risperdal 1 mg at bedtime.  She feels proud that she is not smoking marijuana or drinking.  At this time she does not feel that she need counseling.  Discussed medication side effects and benefits.  She has no rash or itching.  Recommended to call us back if she has any question, or any concerns.  Patient does not have any tremors, rash, headaches, EPS at this time.  Follow-up in 3 months. Discuss safety plan that anytime having active suicidal thoughts or homicidal thoughts then patient need to call 911 or go to the local emergency room.  Clare Casto T., MD 06/10/2016        Patient ID: Virginia Bullocks, female   DOB: 1981/05/17, 35 y.o.   MRN: [id number]

## 2016-06-14 ENCOUNTER — Ambulatory Visit: Payer: Self-pay | Admitting: Internal Medicine

## 2016-07-01 ENCOUNTER — Encounter: Payer: Self-pay | Admitting: Internal Medicine

## 2016-07-01 ENCOUNTER — Ambulatory Visit (INDEPENDENT_AMBULATORY_CARE_PROVIDER_SITE_OTHER): Payer: PRIVATE HEALTH INSURANCE | Admitting: Internal Medicine

## 2016-07-01 DIAGNOSIS — L680 Hirsutism: Secondary | ICD-10-CM | POA: Diagnosis not present

## 2016-07-01 MED ORDER — SPIRONOLACTONE 50 MG PO TABS: 50.0000 mg | ORAL_TABLET | Freq: Two times a day (BID) | ORAL | 5 refills | Status: DC

## 2016-07-01 NOTE — Progress Notes (Signed)
   Subjective:    Patient ID: Virginia Ballard, female    DOB: 1981/06/23, 35 y.o.   MRN: [id number]  HPI  Patient presents for hirsutism f/u.   Hirsutism Patient has now been on spironolactone 50mg  BID for two months. She is pleased with the results, reporting that her hair grows slower now. She has also noticed less hair on her chest and back. Has not tried laser hair removal or other hair removal treatments yet due to cost, but is trying to find a way to work this into her budget. Patient denies any side effects of spiro.   Review of Systems See HPI.     Objective:   Physical Exam  Constitutional: She is oriented to person, place, and time. She appears well-developed and well-nourished.  HENT:  Head: Normocephalic and atraumatic.  Pulmonary/Chest: Effort normal. No respiratory distress.  Neurological: She is alert and oriented to person, place, and time.  Skin:  Hair stubble present on/under chin and neck. One long hair on back. Few strands of hair on chest. Small hyperpigmented macules on face.   Psychiatric: She has a normal mood and affect. Her behavior is normal.      Assessment & Plan:  HIRSUTISM Patient pleased with results on spironolactone 50mg  BID. Would like to continue this dose for now and return to care if hair growth increases again.  - Continue spiro 50mg  BID  Tarri AbernethyAbigail J Rogue Rafalski, MD, MPH PGY-2 [facility] Pager [phone]

## 2016-07-01 NOTE — Patient Instructions (Signed)
It was nice seeing you again today Virginia Ballard!  Continue taking the spironolactone 50mg  twice a day as you have been.   If you have any questions or concerns, please feel free to call the clinic.   Be well,  Dr. Natale MilchLancaster

## 2016-07-01 NOTE — Assessment & Plan Note (Signed)
Patient pleased with results on spironolactone 50mg  BID. Would like to continue this dose for now and return to care if hair growth increases again.  - Continue spiro 50mg  BID

## 2016-08-06 ENCOUNTER — Encounter: Payer: Self-pay | Admitting: Family Medicine

## 2016-08-06 ENCOUNTER — Ambulatory Visit (INDEPENDENT_AMBULATORY_CARE_PROVIDER_SITE_OTHER): Payer: No Typology Code available for payment source | Admitting: Family Medicine

## 2016-08-06 ENCOUNTER — Other Ambulatory Visit (HOSPITAL_COMMUNITY)
Admission: RE | Admit: 2016-08-06 | Discharge: 2016-08-06 | Disposition: A | Discharge status: Home / Self Care | Payer: No Typology Code available for payment source | Source: Ambulatory Visit | Attending: Family Medicine | Admitting: Family Medicine

## 2016-08-06 VITALS — BP 138/71 | HR 107 | Temp 98.1°F | Wt 204.0 lb

## 2016-08-06 DIAGNOSIS — Z113 Encounter for screening for infections with a predominantly sexual mode of transmission: Secondary | ICD-10-CM | POA: Insufficient documentation

## 2016-08-06 DIAGNOSIS — N898 Other specified noninflammatory disorders of vagina: Secondary | ICD-10-CM

## 2016-08-06 DIAGNOSIS — Z23 Encounter for immunization: Secondary | ICD-10-CM

## 2016-08-06 DIAGNOSIS — Z114 Encounter for screening for human immunodeficiency virus [HIV]: Secondary | ICD-10-CM

## 2016-08-06 LAB — POCT WET PREP (WET MOUNT)
Clue Cells Wet Prep Whiff POC: POSITIVE
TRICHOMONAS WET PREP HPF POC: ABSENT

## 2016-08-06 MED ORDER — METRONIDAZOLE 500 MG PO TABS: 500.0000 mg | ORAL_TABLET | Freq: Two times a day (BID) | ORAL | 0 refills | Status: AC

## 2016-08-06 NOTE — Patient Instructions (Signed)

## 2016-08-06 NOTE — Progress Notes (Signed)
    Subjective:  Virginia Ballard is a 35 y.o. female who [facility] with a chief complaint of vaginal discharge.   HPI:  Vaginal Discharge Started two days ago. Described as thick, dark yellow with malodor. Has not tried any treatments. No recent antibiotics. No abdominal pain. No fevers or chills. Wants full STD testing.  ROS: Per HPI  PMH: Smoking history reviewed.    Objective:  Physical Exam: BP 138/71   Pulse (!) 107   Temp 98.1 F (36.7 C) (Oral)   Wt 204 lb (92.5 kg)   BMI 32.93 kg/m   Gen: NAD, resting comfortably CV: RRR with no murmurs appreciated Pulm: NWOB, CTAB with no crackles, wheezes, or rhonchi GI: Normal bowel sounds present. Soft, Nontender, Nondistended. GU: Normal external female genitalia. Scant amount of yellow discharge noted in vaginal vault.  MSK: no edema, cyanosis, or clubbing noted Skin: warm, dry Neuro: grossly normal, moves all extremities Psych: Normal affect and thought content  Results for orders placed or performed in visit on 08/06/16 (from the past 72 hour(s))  POCT Wet Prep Mellody Drown(Wet [place])     Status: Abnormal   Collection Time: 08/06/16  4:31 PM  Result Value Ref Range   Source Wet Prep POC VAG    WBC, Wet Prep HPF POC 1-5    Bacteria Wet Prep HPF POC Moderate (A) None, Few, Too numerous to count   Clue Cells Wet Prep HPF POC Moderate (A) None, Too numerous to count   Clue Cells Wet Prep Whiff POC Positive Whiff    Yeast Wet Prep HPF POC None    Trichomonas Wet Prep HPF POC Absent Absent     Assessment/Plan:  Vaginal Discharge Wet prep consistent with BV. Will treat with course of flagyl. GC/Ct, HIV, and RPR sent. Return precautions reviewed. Follow up as needed.   Katina Degreealeb M. Jimmey RalphParker, MD [facility] Resident PGY-3 08/06/2016 4:50 PM

## 2016-08-07 LAB — RPR

## 2016-08-07 LAB — HIV ANTIBODY (ROUTINE TESTING W REFLEX): HIV: NONREACTIVE

## 2016-08-09 LAB — CERVICOVAGINAL ANCILLARY ONLY
Chlamydia: NEGATIVE
Neisseria Gonorrhea: NEGATIVE

## 2016-08-10 ENCOUNTER — Encounter: Payer: Self-pay | Admitting: Family Medicine

## 2016-09-09 ENCOUNTER — Ambulatory Visit (HOSPITAL_COMMUNITY): Payer: Self-pay | Admitting: Psychiatry

## 2016-10-19 ENCOUNTER — Ambulatory Visit (HOSPITAL_COMMUNITY)
Admission: EM | Admit: 2016-10-19 | Discharge: 2016-10-19 | Disposition: A | Discharge status: Home / Self Care | Payer: Self-pay | Attending: Family Medicine | Admitting: Family Medicine

## 2016-10-19 ENCOUNTER — Encounter (HOSPITAL_COMMUNITY): Payer: Self-pay | Admitting: Emergency Medicine

## 2016-10-19 DIAGNOSIS — L0291 Cutaneous abscess, unspecified: Secondary | ICD-10-CM

## 2016-10-19 MED ORDER — SULFAMETHOXAZOLE-TRIMETHOPRIM 800-160 MG PO TABS: 1.0000 | ORAL_TABLET | Freq: Two times a day (BID) | ORAL | 0 refills | Status: AC

## 2016-10-19 MED ORDER — HYDROCODONE-ACETAMINOPHEN 5-325 MG PO TABS: 1.0000 | ORAL_TABLET | ORAL | 0 refills | Status: DC | PRN

## 2016-10-19 NOTE — ED Provider Notes (Signed)
CSN: [id number]     Arrival date & time 10/19/16  1105 History   First MD Initiated Contact with Patient 10/19/16 1221     Chief Complaint  Patient presents with  . Abscess   (Consider location/radiation/quality/duration/timing/severity/associated sxs/prior Treatment) 36 year old female with complaints of 2 abscesses. One is a small abscess to the left meatus of the ear. It started approximately 2 weeks ago. The second one also started approximately 2 weeks ago is located to the right groin just superior to the inguinal groove. She has a history of similar abscesses which usually occur in the axillae.      Past Medical History:  Diagnosis Date  . Bipolar disorder (HCC)    diagnosed >2 yrs ago  . Depression    diagnosed >2 yrs ago  . Medical history non-contributory    Past Surgical History:  Procedure Laterality Date  . NO PAST SURGERIES     Family History  Problem Relation Age of Onset  . Alcohol abuse Father    Social History  Substance Use Topics  . Smoking status: Current Every Day Smoker    Packs/day: 0.50    Types: Cigarettes  . Smokeless tobacco: Never Used  . Alcohol use 2.4 oz/week    4 Glasses of wine per week   OB History    No data available     Review of Systems  HENT: Negative.   Respiratory: Negative.   Gastrointestinal: Negative.   Skin:       As per history of present illness  Neurological: Negative.   All other systems reviewed and are negative.   Allergies  Patient has no known allergies.  Home Medications   Prior to Admission medications   Medication Sig Start Date End Date Taking? Authorizing Provider  lamoTRIgine (LAMICTAL) 200 MG tablet TAKE 1 TABLET (200 MG TOTAL) BY MOUTH DAILY. 06/10/16  Yes Cleotis NipperSyed T Arfeen, MD  risperiDONE (RISPERDAL) 1 MG tablet Take 1 tablet (1 mg total) by mouth 2 (two) times daily. 06/10/16 06/10/17 Yes Cleotis NipperSyed T Arfeen, MD  spironolactone (ALDACTONE) 50 MG tablet Take 1 tablet (50 mg total) by mouth 2 (two) times  daily. 07/01/16  Yes Marquette SaaAbigail Joseph [place], MD  HYDROcodone-acetaminophen (NORCO/VICODIN) 5-325 MG tablet Take 1 tablet by mouth every 4 (four) hours as needed. 10/19/16   Hayden Rasmussenavid Abena Erdman, NP  sulfamethoxazole-trimethoprim (BACTRIM DS,SEPTRA DS) 800-160 MG tablet Take 1 tablet by mouth 2 (two) times daily. 10/19/16 10/26/16  Hayden Rasmussenavid Jaymison Luber, NP   Meds Ordered and Administered this Visit  Medications - No data to display  BP 126/79 (BP Location: Left Arm)   Pulse 81   Temp 98.6 F (37 C) (Oral)   Resp 16   SpO2 100%  No data found.   Physical Exam  Constitutional: She is oriented to person, place, and time. She appears well-developed and well-nourished. No distress.  HENT:  Small fluctuant lesion at the auditory meatus, positive for tenderness. Minimal erythema. No apparent involvement of the outer ear or cartilage. No cellulitis. No involvement or tenderness of the mastoid or postauricular soft tissues. No apparent involvement of the EAC. It is well marginated.  Cardiovascular: Normal rate.   Pulmonary/Chest: Effort normal.  Neurological: She is alert and oriented to person, place, and time.  Skin: Skin is warm and dry.  The second abscess formation is located in the fatty suprapubic area just right of the suprapubic bone and just superior to the inguinal fold. The induration measures approximately 3 cm across by 20 cm  wide. Positive for tenderness. No surrounding redness or cellulitis.  Nursing note and vitals reviewed.   Urgent Care Course     Procedures (including critical care time) Procedure for the lesion to the left outer ear: The area was disinfected with Betadine and alcohol. The entire procedure was explained to the patient. Using a 30-gauge needle approximately a fourth of the cc was injected into the lesion however due to pain the patient was unable to tolerate additional attempts. During the injection process a significant amount of white purulent material ejected from the core and  the patient did allow additional manual expression of approximately 3-4 cc of exudate.  The second lesion just above the right inguinal fold was prepped with Betadine. The area was injected with 1% Xylocaine total of 9 cc. A 1.5 cm incision was made with 11 blade scalpel. A large amount of purulent material ejected from the lesion. Loculations were broken up with hemostats. And a moderate to large amount of purulent exudate was expressed. Wound was then packed and dressed. Assistant was with [employer], multimedia. Labs Review Labs Reviewed - No data to display  Imaging Review No results found.   Visual Acuity Review  Right Eye Distance:   Left Eye Distance:   Bilateral Distance:    Right Eye Near:   Left Eye Near:    Bilateral Near:         MDM   1. Abscess    Apply warm compresses over the areas that were incised today. This will help with drainage. And comfort. Return in 2 days for removal of packing of the abscess in the right groin. Take medication as directed. For worsening, new symptoms or problems may return sooner. Try to keep the area dry for the first couple days until after the packing is removed. Meds ordered this encounter  Medications  . sulfamethoxazole-trimethoprim (BACTRIM DS,SEPTRA DS) 800-160 MG tablet    Sig: Take 1 tablet by mouth 2 (two) times daily.    Dispense:  14 tablet    Refill:  0    Order Specific Question:   Supervising Provider    Answer:   Linna Hoff [phone]  . HYDROcodone-acetaminophen (NORCO/VICODIN) 5-325 MG tablet    Sig: Take 1 tablet by mouth every 4 (four) hours as needed.    Dispense:  15 tablet    Refill:  0    Order Specific Question:   Supervising Provider    Answer:   Linna Hoff [[id number]]       Hayden Rasmussen, NP 10/19/16 1355

## 2016-10-19 NOTE — ED Notes (Signed)
Chaperoned Hayden Rasmussenavid Mabe, NP on an I&D to the right groin.  Pt tolerated the procedure fairly well.  She was tearful, but stated she was ok.  I placed a dry gauze dressing over the packing and secured it with paper tape.

## 2016-10-19 NOTE — Discharge Instructions (Signed)
Apply warm compresses over the areas that were incised today. This will help with drainage. And comfort. Return in 2 days for removal of packing of the abscess in the right groin. Take medication as directed. For worsening, new symptoms or problems may return sooner. Try to keep the area dry for the first couple days until after the packing is removed.

## 2016-10-19 NOTE — ED Triage Notes (Signed)
Here for abscess inside left ear onset 2 weeks and the past 5 days it's been getting bigger and more painful w/a localized fever  Denies drainage  Also c/o abscess on groin onset 8 days... Getting bigger and more painful as well  Denies drainage  A&O x4... NAD

## 2016-10-26 ENCOUNTER — Ambulatory Visit (HOSPITAL_COMMUNITY): Payer: Self-pay | Admitting: Psychiatry

## 2016-12-26 ENCOUNTER — Other Ambulatory Visit (HOSPITAL_COMMUNITY): Payer: Self-pay | Admitting: Psychiatry

## 2016-12-26 DIAGNOSIS — F3131 Bipolar disorder, current episode depressed, mild: Secondary | ICD-10-CM

## 2016-12-30 ENCOUNTER — Other Ambulatory Visit (HOSPITAL_COMMUNITY): Payer: Self-pay | Admitting: Psychiatry

## 2016-12-30 ENCOUNTER — Telehealth (HOSPITAL_COMMUNITY): Payer: Self-pay

## 2016-12-30 DIAGNOSIS — F3131 Bipolar disorder, current episode depressed, mild: Secondary | ICD-10-CM

## 2016-12-30 NOTE — Telephone Encounter (Signed)
Medication refill request - Fax received from [facility] on [address] for a refill of patient's Lamictal 200 mg tablets, last ordered 06/10/17 + 2 refills when pt. last seen.  Pt. canceled 09/09/17 and 10/26/16 due to new job and working. No current appt.

## 2016-12-31 MED ORDER — LAMOTRIGINE 200 MG PO TABS: ORAL_TABLET | ORAL | 0 refills | Status: DC

## 2016-12-31 NOTE — Telephone Encounter (Signed)
We can do 30 day supply only but he has to see in the office for future refills.

## 2016-12-31 NOTE — Telephone Encounter (Signed)
Medication management - Message left for patient Dr. Lolly Mustache approved a one time 30 day refill of her Lamictal but that patient would have to schedule a new appointment for any further refills. New one time order e-scribed to [facility]'s [facility] [address] with no refills and message no further refills until patient evaluated as authorized by Dr. Lolly Mustache.  Requested patient call for appointment.

## 2017-01-06 ENCOUNTER — Encounter (HOSPITAL_COMMUNITY): Payer: Self-pay | Admitting: Psychiatry

## 2017-01-06 ENCOUNTER — Ambulatory Visit (INDEPENDENT_AMBULATORY_CARE_PROVIDER_SITE_OTHER): Payer: Self-pay | Admitting: Psychiatry

## 2017-01-06 VITALS — BP 104/68 | HR 95 | Ht 66.0 in | Wt 221.0 lb

## 2017-01-06 DIAGNOSIS — F1290 Cannabis use, unspecified, uncomplicated: Secondary | ICD-10-CM

## 2017-01-06 DIAGNOSIS — F3131 Bipolar disorder, current episode depressed, mild: Secondary | ICD-10-CM

## 2017-01-06 DIAGNOSIS — Z811 Family history of alcohol abuse and dependence: Secondary | ICD-10-CM

## 2017-01-06 DIAGNOSIS — F17210 Nicotine dependence, cigarettes, uncomplicated: Secondary | ICD-10-CM

## 2017-01-06 MED ORDER — LAMOTRIGINE 200 MG PO TABS: ORAL_TABLET | ORAL | 2 refills | Status: DC

## 2017-01-06 MED ORDER — RISPERIDONE 1 MG PO TABS: 1.0000 mg | ORAL_TABLET | Freq: Two times a day (BID) | ORAL | 2 refills | Status: DC

## 2017-01-06 NOTE — Progress Notes (Signed)
BH MD/PA/NP OP Progress Note  01/06/2017 8:44 AM Virginia Ballard  MRN:  [id number]  Chief Complaint:  Subjective:  I decided to move back to [facility] to live with my parents.  HPI: Virginia Ballard came for her follow-up appointment.  She was last seen in September.  She missed appointment because she lost her job.  She admitted lately has been under a lot of stress because she did not have enough support system in [place].  She quit her job in November at [employer] due to feeling overwhelmed.  She had tried few part-time jobs since then but unable to do it and continued to get overwhelmed and stressed out.  Not she had decided to move back to [facility] to live with her parents and 37 year old daughter.  Patient admitted that she missed her family a lot including her 37 -year-old daughter.  Patient endorse her relationship with the boyfriend was also ended left her last year.  She admitted sometimes difficulty falling asleep because she has a lot of racing thoughts and sometimes consider herself a year.  However since she decided to move back with her parents she is more calm, relaxed and more optimistic and hopeful.  So far she has no job and [facility] but she is determined to find one.  Patient has history of working multiple jobs in the past.  Patient today appears sad and somewhat disappointed because she tried enough to stay in [place] but did not work out.  She denies any paranoia, hallucination, nightmares, flashback.  She denies any suicidal thoughts or homicidal thought.  Her energy level is fair.  She has no tremors or shakes.  She is taking Risperdal 1 mg twice a day and Lamictal 200 mg daily.  She has no rash or itching.  She like to continue these medication since it is helping her.  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  Patient denies drinking alcohol or using any illegal substances.  She lives by herself and she is moving at the end of this  month.  Visit Diagnosis:    ICD-9-CM ICD-10-CM   1. Bipolar affective disorder, currently depressed, mild (HCC) 296.51 F31.31     Past Psychiatric History: Reviewed. Patient taking psychiatric medications since age 36.  She had tried Celexa, lithium, Seroquel, Haldol, trazodone and Lamictal.  She remember having a good response with Lamictal.  These prescriptions were given at [facility].  Patient has at least 2 psychiatric hospitalization.  In 18-Feb-2007 when her grandfather died and in 02-18-2012 her grandmother died and she could not handle the loss.  She was very depressed and having suicidal thoughts and she tried to cut her wrist in 18-Feb-2012.  Patient was given Ritalin when she was in intensive outpatient program in January 2017.  She was diagnosed with ADD symptoms.  Patient has never psychological testing.  She was diagnosed with borderline percent disorder.  She has history of cutting herself.  Past Medical History:  Past Medical History:  Diagnosis Date  . Bipolar disorder (HCC)    diagnosed >2 yrs ago  . Depression    diagnosed >2 yrs ago  . Medical history non-contributory     Past Surgical History:  Procedure Laterality Date  . NO PAST SURGERIES      Family Psychiatric History: Reviewed.  Family History:  Family History  Problem Relation Age of Onset  . Alcohol abuse Father     Social History:  Social History  Social History  . Marital status: Married    Spouse name: N/A  . Number of children: N/A  . Years of education: N/A   Social History Main Topics  . Smoking status: Current Every Day Smoker    Packs/day: 0.50    Types: Cigarettes  . Smokeless tobacco: Never Used  . Alcohol use 2.4 oz/week    4 Glasses of wine per week  . Drug use: Yes    Types: Marijuana     Comment: one blunt daily (THC)  . Sexual activity: Yes    Birth control/ protection: IUD   Other Topics Concern  . Not on file   Social History Narrative  . No narrative on file    Allergies: No  Known Allergies  Metabolic Disorder Labs: No results found for: HGBA1C, MPG No results found for: PROLACTIN No results found for: CHOL, TRIG, HDL, CHOLHDL, VLDL, LDLCALC   Current Medications: Current Outpatient Prescriptions  Medication Sig Dispense Refill  . HYDROcodone-acetaminophen (NORCO/VICODIN) 5-325 MG tablet Take 1 tablet by mouth every 4 (four) hours as needed. 15 tablet 0  . lamoTRIgine (LAMICTAL) 200 MG tablet TAKE 1 TABLET (200 MG TOTAL) BY MOUTH DAILY. 30 tablet 0  . risperiDONE (RISPERDAL) 1 MG tablet Take 1 tablet (1 mg total) by mouth 2 (two) times daily. 60 tablet 2  . spironolactone (ALDACTONE) 50 MG tablet Take 1 tablet (50 mg total) by mouth 2 (two) times daily. 60 tablet 5   No current facility-administered medications for this visit.     Neurologic: Headache: No Seizure: No Paresthesias: No  Musculoskeletal: Strength & Muscle Tone: within normal limits Gait & Station: normal Patient leans: N/A  Psychiatric Specialty Exam: Review of Systems  Constitutional: Negative for weight loss.  Skin: Negative for itching and rash.  Neurological: Negative.     Blood pressure 104/68, pulse 95, height  (1.676 m), weight 221 lb (100.2 kg), SpO2 98 %.There is no height or weight on file to calculate BMI.  General Appearance: Fairly Groomed  Eye Contact:  Good  Speech:  Slow  Volume:  Decreased  Mood:  Anxious and Tearful  Affect:  Constricted  Thought Process:  Goal Directed  Orientation:  Full (Time, Place, and Person)  Thought Content: Rumination   Suicidal Thoughts:  No  Homicidal Thoughts:  No  Memory:  Immediate;   Good Recent;   Good Remote;   Good  Judgement:  Good  Insight:  Good  Psychomotor Activity:  Decreased  Concentration:  Concentration: Fair and Attention Span: Fair  Recall:  Good  Fund of Knowledge: Good  Language: Good  Akathisia:  No  Handed:  Right  AIMS (if indicated):  0  Assets:  Communication Skills Desire for  Improvement Physical Health Resilience Social Support  ADL's:  Intact  Cognition: WNL  Sleep:  Fair    Assessment: Bipolar disorder, depressed type.  Cannabis abuse.  Rule out borderline personality disorder  Plan: Discuss patient disposition as patient is decided to move back [facility] to live with her parents and her 67 year old daughter.  She is disappointed but also like to start new life there because she has enough support system.  Patient does not want to change her medication.  I will continue Risperdal 1 mg twice a day and Lamictal 200 mg daily.  Patient has no side effects.  She will contact her previous provider at a regular health services.  Discuss in length medication side effects and benefits.  Discuss safety plan  that anytime having active suicidal thoughts or homicidal thoughts then she need to call 911 or go to the local emergency room.  We will not schedule appointment as patient is moving back to [facility]. Venie Montesinos T., MD 01/06/2017, 8:44 AM

## 2018-06-12 ENCOUNTER — Emergency Department (HOSPITAL_COMMUNITY)
Admission: EM | Admit: 2018-06-12 | Discharge: 2018-06-12 | Disposition: A | Discharge status: Home / Self Care | Payer: Medicaid Other | Attending: Emergency Medicine | Admitting: Emergency Medicine

## 2018-06-12 ENCOUNTER — Encounter (HOSPITAL_COMMUNITY): Payer: Self-pay | Admitting: Emergency Medicine

## 2018-06-12 DIAGNOSIS — J45909 Unspecified asthma, uncomplicated: Secondary | ICD-10-CM | POA: Diagnosis not present

## 2018-06-12 DIAGNOSIS — Z79899 Other long term (current) drug therapy: Secondary | ICD-10-CM | POA: Diagnosis not present

## 2018-06-12 DIAGNOSIS — F17210 Nicotine dependence, cigarettes, uncomplicated: Secondary | ICD-10-CM | POA: Diagnosis not present

## 2018-06-12 DIAGNOSIS — R079 Chest pain, unspecified: Secondary | ICD-10-CM | POA: Insufficient documentation

## 2018-06-12 DIAGNOSIS — R0789 Other chest pain: Secondary | ICD-10-CM

## 2018-06-12 MED ORDER — IBUPROFEN 400 MG PO TABS
600.0000 mg | ORAL_TABLET | Freq: Once | ORAL | Status: AC
  Administered 2018-06-12: 600 mg via ORAL
  Filled 2018-06-12: qty 1

## 2018-06-12 NOTE — Discharge Instructions (Addendum)
Please read attached information. If you experience any new or worsening signs or symptoms please return to the emergency room for evaluation. Please follow-up with your primary care provider or specialist as discussed.  °
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
## 2018-08-23 ENCOUNTER — Other Ambulatory Visit: Payer: Self-pay

## 2018-08-23 ENCOUNTER — Ambulatory Visit: Payer: Medicaid Other | Admitting: Family Medicine

## 2018-08-23 VITALS — BP 118/82 | HR 97 | Temp 98.3°F | Ht 62.0 in | Wt 220.0 lb

## 2018-08-23 DIAGNOSIS — R82998 Other abnormal findings in urine: Secondary | ICD-10-CM

## 2018-08-23 DIAGNOSIS — R7303 Prediabetes: Secondary | ICD-10-CM | POA: Diagnosis not present

## 2018-08-23 LAB — POCT GLYCOSYLATED HEMOGLOBIN (HGB A1C): Hemoglobin A1C: 5.6 % (ref 4.0–5.6)

## 2018-08-23 LAB — POCT URINALYSIS DIP (MANUAL ENTRY)
BILIRUBIN UA: NEGATIVE
BILIRUBIN UA: NEGATIVE mg/dL
GLUCOSE UA: NEGATIVE mg/dL
Nitrite, UA: NEGATIVE
Protein Ur, POC: NEGATIVE mg/dL
Spec Grav, UA: 1.025 (ref 1.010–1.025)
UROBILINOGEN UA: 0.2 U/dL
pH, UA: 6 (ref 5.0–8.0)

## 2018-08-23 NOTE — Progress Notes (Signed)
   HPI 37 year old female who presents for strong smelling urine.  She is also noticed a dark color over the last 2 to 3 days.  Patient states that she has not eaten any out of the ordinary foods, including asparagus.  She has been drinking some coffee recently.  She feels that she is not been drinking as much liquid as usual.  She has had no painful urination, no burning urination, no pelvic tenderness, or any other symptoms consistent with UTI.  Patient is also been having to urinate more frequently than usual.  She request a hemoglobin A1c to check her diabetes.  At her previous provider she was told she was "prediabetic".  A UA was performed which showed small blood trace leukocytes.  She does have a IUD in place.  Does not think she is on her period.  Has not had one in several months.  Has not noticed any frank blood in her urine.  CC: Strong smelling urine   ROS:   Review of Systems See HPI for ROS.   CC, SH/smoking status, and VS noted  Objective: BP 118/82   Pulse 97   Temp 98.3 F (36.8 C) (Oral)   Ht 5\' 2"  (1.575 m)   Wt 220 lb (99.8 kg)   SpO2 99%   BMI 40.24 kg/m  Gen: 37 year old African-American female, resting comfortably, no acute distress HEENT: NCAT, EOMI, PERRL CV: RRR, no murmur Resp: CTAB, no wheezes, non-labored Abd: SNTND, BS present, no guarding or organomegaly Back: No flank tenderness bilaterally Neuro: Alert and oriented, Speech clear, No gross deficits   Assessment and plan:  Dark urine Patient with no symptoms consistent with urinary tract infection.  Blood on her UA could be from a variety of causes including her IUD.  Her dark urine is likely due to dehydration.  Advised patient to increase her water intake by 2 glasses/day and limit sweet tea.  Pre-diabetes Hemoglobin A1c performed, 5.6.  No medication or further work-up indicated.  Can repeat A1c in 3 months per PCP discretion.   Orders Placed This Encounter  Procedures  . POCT  urinalysis dipstick  . POCT glycosylated hemoglobin (Hb A1C)    Associate with Z13.1    No orders of the defined types were placed in this encounter.    Myrene BuddyJacob Twisha Vanpelt MD PGY-2 Family Medicine Resident  08/25/2018 11:29 AM

## 2018-08-23 NOTE — Patient Instructions (Addendum)
It was great seeing you today! I am sorry that you have been having increased smells and darkness to your urine. Your urinalysis just showed some trace red and white blood cells. There are many reasons you can have this in your urine, and none that require any workup at this time. Your urine was a little concentrated, which makes me believe that your symptoms are likely due to dehydration. I recommend increasing your water intake. I would likely add on an extra 2-3 glasses per day of water, and decrease your sweet tea intake as well.  In regards to your diabetes. I will check your a1c and call you with the results. I will let you know the plan when we talk on the phone.

## 2018-08-25 DIAGNOSIS — R7303 Prediabetes: Secondary | ICD-10-CM | POA: Insufficient documentation

## 2018-08-25 DIAGNOSIS — R82998 Other abnormal findings in urine: Secondary | ICD-10-CM | POA: Insufficient documentation

## 2018-08-25 NOTE — Assessment & Plan Note (Signed)
Patient with no symptoms consistent with urinary tract infection.  Blood on her UA could be from a variety of causes including her IUD.  Her dark urine is likely due to dehydration.  Advised patient to increase her water intake by 2 glasses/day and limit sweet tea.

## 2018-08-25 NOTE — Assessment & Plan Note (Signed)
Hemoglobin A1c performed, 5.6.  No medication or further work-up indicated.  Can repeat A1c in 3 months per PCP discretion.

## 2018-08-29 ENCOUNTER — Ambulatory Visit: Payer: Medicaid Other | Admitting: Family Medicine

## 2018-08-29 VITALS — BP 122/80 | HR 106 | Temp 99.4°F | Wt 217.8 lb

## 2018-08-29 DIAGNOSIS — H61002 Unspecified perichondritis of left external ear: Secondary | ICD-10-CM | POA: Insufficient documentation

## 2018-08-29 MED ORDER — CIPROFLOXACIN HCL 500 MG PO TABS: 500.0000 mg | ORAL_TABLET | Freq: Two times a day (BID) | ORAL | 0 refills | Status: DC

## 2018-08-29 MED ORDER — HYDROCODONE-ACETAMINOPHEN 5-325 MG PO TABS: 1.0000 | ORAL_TABLET | Freq: Four times a day (QID) | ORAL | 0 refills | Status: DC | PRN

## 2018-08-29 NOTE — Patient Instructions (Addendum)
Thank you for coming in to see us today. Please see below to review our plan for today's visit.  Take the Cipro twice daily for 10 days.  I have given you a couple of hydrocodone for pain which you can take every 6 hours for severe pain.  I recommend taking naproxen 500 mg twice daily. We will see you on Friday at 11:30 AM.  If you develop worsening swelling or redness, call our clinic immediately.  Please call the clinic at [phone] if your symptoms worsen or you have any concerns. It was our pleasure to serve you.  Durward Parcelavid McMullen, DO [facility], PGY-3

## 2018-08-29 NOTE — Assessment & Plan Note (Signed)
Acute on chronic.  Localized abscess to external meatus.  There is local induration anterior to abscess but no signs of mastoiditis.  Patient does not have trismus.  Given involvement of cartilage, will treat for gram negatives.  I&D was attempted with some success with purulent discharge, however patient was unable to tolerate complete drainage. - Culture obtained - Initiating Cipro 500 mg twice daily for 10 days - Given Norco 5-325 mg every 6 hours as needed for moderate pain with instructions to use naproxen for mild pain - Scheduled follow-up appointment on 09/01/2018 at 11:30 AM - Reviewed return precautions

## 2018-08-29 NOTE — Progress Notes (Signed)
Subjective   Patient ID: Virginia Ballard    DOB: 27-Sep-1980, 37 y.o. female   MRN: [id number]  CC: "Abscess in left ear"  HPI: Virginia Ballard is a 37 y.o. female who presents to clinic today for the following:  EAR PAIN  Location: left ear Ear pain started: 5 days ago, progressed to left facial swelling over last 24 ours Pain is: throbbing Medications tried: Ambesol without relief Recent ear trauma: no Prior ear surgeries: no Antibiotics in the last 30 days: no History of diabetes: no  Symptoms Ear discharge: no Fever: no Pain with chewing: yes Ringing in ears: no Dizziness: no Hearing loss: muffled Rashes or blisters around ear: no Weight loss: no  Of note, patient was seen at an urgent care back in January 2018 for small abscess of left ear meatus which was lanced with exudative discharge.  Patient was given Bactrim at discharge.  ROS: see HPI for pertinent.  PMFSH: Reviewed. Smoking status reviewed. Medications reviewed.  Objective   BP 122/80   Pulse (!) 106   Temp 99.4 F (37.4 C) (Oral)   Wt 217 lb 12.8 oz (98.8 kg)   SpO2 100%   BMI 39.84 kg/m  Vitals and nursing note reviewed.  General: well nourished, well developed, NAD with non-toxic appearance HEENT: normocephalic, atraumatic, moist mucous membranes, small abscess at inferior surface of external meatus of left ear without purulence or bleeding, otoscope easily able to advance with intact TM, there is moderate tenderness and mild associated edema anterior to the left ear sparing the posterior area including the mastoid process, patient able to open mouth without difficulty and no signs of abscess along gumline Neck: supple, non-tender without lymphadenopathy Cardiovascular: regular rate and rhythm without murmurs, rubs, or gallops Lungs: clear to auscultation bilaterally with normal work of breathing Skin: warm, dry, cap refill < 2 seconds Extremities: warm and well perfused, normal tone, no  edema  Incision and Drainage Procedure Note: Left Ear The affected area was cleaned and draped in a sterile fashion. Anesthesia was achieved using 1 mL of 2% Lidocaine without epinephrine injected around the wound area using a 25-guage 1.5 inch needle. An 11-blade scalpel was used to incise the wound.  A moderate amount of purulent drainage was produced following lancing.  A culture was obtained. A hemostat was used to break any loculations that were present. Iodoform guaze was used to pack the wound. A sterile dressing was applied to the area. The patient tolerated the procedure well. No complications were encountered.  Assessment & Plan   Perichondritis of ear, left Acute on chronic.  Localized abscess to external meatus.  There is local induration anterior to abscess but no signs of mastoiditis.  Patient does not have trismus.  Given involvement of cartilage, will treat for gram negatives.  I&D was attempted with some success with purulent discharge, however patient was unable to tolerate complete drainage. - Culture obtained - Initiating Cipro 500 mg twice daily for 10 days - Given Norco 5-325 mg every 6 hours as needed for moderate pain with instructions to use naproxen for mild pain - Scheduled follow-up appointment on 09/01/2018 at 11:30 AM - Reviewed return precautions  Orders Placed This Encounter  Procedures  . Anaerobic and Aerobic Culture   Meds ordered this encounter  Medications  . ciprofloxacin (CIPRO) 500 MG tablet    Sig: Take 1 tablet (500 mg total) by mouth 2 (two) times daily.    Dispense:  20 tablet  Refill:  0  . HYDROcodone-acetaminophen (NORCO) 5-325 MG tablet    Sig: Take 1 tablet by mouth every 6 (six) hours as needed for moderate pain.    Dispense:  4 tablet    Refill:  0    Durward Parcelavid , DO [facility], PGY-3 08/29/2018, 4:32 PM

## 2018-09-01 ENCOUNTER — Other Ambulatory Visit: Payer: Self-pay

## 2018-09-01 ENCOUNTER — Encounter: Payer: Self-pay | Admitting: Family Medicine

## 2018-09-01 ENCOUNTER — Ambulatory Visit: Payer: Medicaid Other | Admitting: Family Medicine

## 2018-09-01 VITALS — BP 116/64 | HR 74 | Temp 98.8°F | Wt 216.0 lb

## 2018-09-01 DIAGNOSIS — H61002 Unspecified perichondritis of left external ear: Secondary | ICD-10-CM | POA: Diagnosis present

## 2018-09-01 MED ORDER — SULFAMETHOXAZOLE-TRIMETHOPRIM 800-160 MG PO TABS: 1.0000 | ORAL_TABLET | Freq: Two times a day (BID) | ORAL | 0 refills | Status: AC

## 2018-09-01 NOTE — Patient Instructions (Signed)

## 2018-09-01 NOTE — Progress Notes (Addendum)
Subjective:     Patient ID: Virginia Ballard, female   DOB: [birth year], 37 y.o.   MRN: [id number]  Otalgia   There is pain in the left ear. This is a new problem. The current episode started in the past 7 days (7 days ago ). The problem occurs constantly. Progression since onset: Slight improvement. There has been no fever. The pain is at a severity of 4/10 (When touched pain scale goes up to 6/10 in severity). The pain is moderate. Pertinent negatives include no headaches or hearing loss. Associated symptoms comments: Left ear discharge. Virginia Ballard has tried antibiotics (Opioid) for the symptoms. The treatment provided moderate relief. There is no history of hearing loss.  Virginia Ballard is on day 3 of Ciprofloxacin without an improvement.  Current Outpatient Medications on File Prior to Visit  Medication Sig Dispense Refill  . ciprofloxacin (CIPRO) 500 MG tablet Take 1 tablet (500 mg total) by mouth 2 (two) times daily. 20 tablet 0  . HYDROcodone-acetaminophen (NORCO) 5-325 MG tablet Take 1 tablet by mouth every 6 (six) hours as needed for moderate pain. 4 tablet 0  . lamoTRIgine (LAMICTAL) 200 MG tablet TAKE 1 TABLET (200 MG TOTAL) BY MOUTH DAILY. 30 tablet 2  . risperiDONE (RISPERDAL) 1 MG tablet Take 1 tablet (1 mg total) by mouth 2 (two) times daily. 60 tablet 2   No current facility-administered medications on file prior to visit.    Past Medical History:  Diagnosis Date  . Bipolar disorder (HCC)    diagnosed >2 yrs ago  . Depression    diagnosed >2 yrs ago  . Medical history non-contributory      Review of Systems  HENT: Positive for ear pain. Negative for hearing loss.   Respiratory: Negative.   Cardiovascular: Negative.   Gastrointestinal: Negative.   Neurological: Negative.  Negative for headaches.  All other systems reviewed and are negative.      Objective:   Physical Exam Vitals signs and nursing note reviewed.  Constitutional:      General: Virginia Ballard is not in acute distress.  Appearance: Normal appearance. Virginia Ballard is not ill-appearing.  HENT:     Head: Normocephalic.     Right Ear: Tympanic membrane, ear canal and external ear normal. No drainage. Tympanic membrane is not injected, erythematous or bulging.     Left Ear: Tympanic membrane and ear canal normal. No drainage. Tympanic membrane is not injected, erythematous or bulging.     Ears:   Neurological:     Mental Status: Virginia Ballard is alert.         POST-OP PICTURE     Assessment:  Perichondritis of ear, left    Plan:     I&D discussed vs watchful waiting on A/B treatment. Patient opted for I&D. Both verbal and written informed consent obtained.  PRE-OP DIAGNOSIS: _ Perichondritis of ear, left  POST-OP DIAGNOSIS: Same   PROCEDURE: incision and drainage of abscess  Performing Physician: Janit PaganKehinde Amara Justen, MD, MPH  PROCEDURE:  A timeout protocol was performed prior to initiating the procedure.  The area was prepared and draped in the usual, sterile manner. The site was anesthetized with  2 cc of 1% lidocaine with epinephrine (an auriculotemporal nerve block performed few inches away from the tragus completely avoiding the earlobe and the tragus- see marker on the post-op picture). A linear incision along the local skin lines was made and the purulent material expressed. The abcess was explored thoroughly, cheesy material mixed with blood was expressed.Sequestered pockets were opened  and packed with iodoform gauze. Bleeding was minimal. Unclear if there is a cyst capsule; if there is reoccurrence Virginia Ballard might need exploration for capsule removal, in which case Virginia Ballard will need a Head and Neck Surg referral. I discussed this with her and Virginia Ballard verbalized understanding.  Packing: Iodoform gauze  Switch from Ciprofloxacin to Bactrim.   Followup: The patient tolerated the procedure well without complications.  Standard post-procedure care is explained and return precautions are given. F/U in 3 days for  reassessment/wound check.

## 2018-09-03 LAB — ANAEROBIC AND AEROBIC CULTURE

## 2018-09-04 ENCOUNTER — Ambulatory Visit: Payer: Self-pay

## 2018-09-04 ENCOUNTER — Ambulatory Visit (INDEPENDENT_AMBULATORY_CARE_PROVIDER_SITE_OTHER): Payer: Medicaid Other | Admitting: Family Medicine

## 2018-09-04 ENCOUNTER — Encounter: Payer: Self-pay | Admitting: Family Medicine

## 2018-09-04 DIAGNOSIS — H61002 Unspecified perichondritis of left external ear: Secondary | ICD-10-CM | POA: Diagnosis present

## 2018-09-04 NOTE — Patient Instructions (Signed)
It was nice seeing you today. I am glad your left ear looks better. Let us give it more time. If no improvement or complete resolution, we will need to refer you to ENT specialist for cyst removal.

## 2018-09-04 NOTE — Progress Notes (Signed)
  Subjective:     Patient ID: Virginia Ballard, female   DOB: [birth year], 37 y.o.   MRN: [id number]  HPI Left earlobe infection: Here for f/u. She feels this has improved a bit. She is compliant with her Bactrim and she d/ced Cipro as instructed. No new concern.  Current Outpatient Medications on File Prior to Visit  Medication Sig Dispense Refill  . sulfamethoxazole-trimethoprim (BACTRIM DS,SEPTRA DS) 800-160 MG tablet Take 1 tablet by mouth 2 (two) times daily for 5 days. 10 tablet 0  . HYDROcodone-acetaminophen (NORCO) 5-325 MG tablet Take 1 tablet by mouth every 6 (six) hours as needed for moderate pain. 4 tablet 0  . lamoTRIgine (LAMICTAL) 200 MG tablet TAKE 1 TABLET (200 MG TOTAL) BY MOUTH DAILY. 30 tablet 2  . risperiDONE (RISPERDAL) 1 MG tablet Take 1 tablet (1 mg total) by mouth 2 (two) times daily. 60 tablet 2   No current facility-administered medications on file prior to visit.    Past Medical History:  Diagnosis Date  . Bipolar disorder (HCC)    diagnosed >2 yrs ago  . Depression    diagnosed >2 yrs ago  . Medical history non-contributory    There were no vitals filed for this visit.   Review of Systems  Constitutional: Negative.   HENT:       Left ear infection  All other systems reviewed and are negative.      Objective:   Physical Exam Constitutional:      General: She is not in acute distress.    Appearance: She is not ill-appearing.  HENT:     Head: Normocephalic.     Comments: Left ear mass close to her tragus is reduced in size compare to prior visit.    CURRENT IMAGE   PREVIOUS       Assessment:     Perichondritis left ear    Plan:     Improving. Continue Bactrim till completion. F/U in 1-2 weeks.  If there is no total resolution, she will need ENT referral for cyst capsule removal. She agreed with plan and verbalized understanding.

## 2018-09-26 ENCOUNTER — Encounter: Payer: Self-pay | Admitting: Family Medicine

## 2018-09-26 ENCOUNTER — Ambulatory Visit (INDEPENDENT_AMBULATORY_CARE_PROVIDER_SITE_OTHER): Payer: Medicaid Other | Admitting: Family Medicine

## 2018-09-26 VITALS — BP 128/64 | HR 71 | Temp 98.2°F | Ht 62.0 in | Wt 222.0 lb

## 2018-09-26 DIAGNOSIS — Z23 Encounter for immunization: Secondary | ICD-10-CM | POA: Diagnosis not present

## 2018-09-26 DIAGNOSIS — H61002 Unspecified perichondritis of left external ear: Secondary | ICD-10-CM

## 2018-09-26 NOTE — Progress Notes (Signed)
  Subjective:     Patient ID: Virginia Ballard, female   DOB: 1981-02-29, 38 y.o.   MRN: [id number]  HPI Perichondritis: Here for f/u. S/P I&D as well as A/B treatment. Swelling has resolved and she has very minimal pain only when she presses on it. No new concern.  Current Outpatient Medications on File Prior to Visit  Medication Sig Dispense Refill  . HYDROcodone-acetaminophen (NORCO) 5-325 MG tablet Take 1 tablet by mouth every 6 (six) hours as needed for moderate pain. 4 tablet 0  . lamoTRIgine (LAMICTAL) 200 MG tablet TAKE 1 TABLET (200 MG TOTAL) BY MOUTH DAILY. 30 tablet 2  . risperiDONE (RISPERDAL) 1 MG tablet Take 1 tablet (1 mg total) by mouth 2 (two) times daily. 60 tablet 2   No current facility-administered medications on file prior to visit.    Past Medical History:  Diagnosis Date  . Bipolar disorder (HCC)    diagnosed >2 yrs ago  . Depression    diagnosed >2 yrs ago  . Medical history non-contributory    Vitals:   09/26/18 0836  BP: 128/64  Pulse: 71  Temp: 98.2 F (36.8 C)  TempSrc: Oral  Weight: 222 lb (100.7 kg)  Height: 5\' 2"  (1.575 m)     Review of Systems  HENT: Negative.  Negative for ear discharge, ear pain and hearing loss.   Respiratory: Negative.   Cardiovascular: Negative.   Gastrointestinal: Negative.   Genitourinary: Negative.   All other systems reviewed and are negative.      Objective:   Physical Exam Vitals signs and nursing note reviewed.  Constitutional:      Appearance: Normal appearance.  HENT:     Right Ear: External ear normal.     Left Ear: External ear normal.     Ears:     Comments: No obvious mass or swelling or erythema on her left ear/tragus. Cardiovascular:     Rate and Rhythm: Normal rate and regular rhythm.     Heart sounds: Normal heart sounds. No murmur.  Pulmonary:     Effort: Pulmonary effort is normal. No respiratory distress.     Breath sounds: No stridor. No wheezing or rhonchi.  Neurological:   Mental Status: She is alert.    CURRENT IMAGE     PREVIOUS IMAGE      Assessment:     Perichondritis left ear    Plan:     Swelling resolved. Still very mild tenderness. Monitor for complete resolution.  As discussed with the patient, we will hold off on ENT referral for now. Consider referral if it reoccurs. She agreed with the plan.   Flu shot was given to her today.

## 2018-09-26 NOTE — Patient Instructions (Signed)
It was nice seeing you today. Your left ear healed well. See us as needed. Have a happy new year.

## 2018-11-14 ENCOUNTER — Encounter: Payer: Self-pay | Admitting: Family Medicine

## 2018-11-14 ENCOUNTER — Other Ambulatory Visit: Payer: Self-pay

## 2018-11-14 ENCOUNTER — Other Ambulatory Visit (HOSPITAL_COMMUNITY)
Admission: RE | Admit: 2018-11-14 | Discharge: 2018-11-14 | Disposition: A | Discharge status: Home / Self Care | Payer: Medicaid Other | Source: Ambulatory Visit | Attending: Family Medicine | Admitting: Family Medicine

## 2018-11-14 ENCOUNTER — Ambulatory Visit (INDEPENDENT_AMBULATORY_CARE_PROVIDER_SITE_OTHER): Payer: Medicaid Other | Admitting: Family Medicine

## 2018-11-14 VITALS — BP 110/80 | Temp 98.2°F | Wt 218.8 lb

## 2018-11-14 DIAGNOSIS — F319 Bipolar disorder, unspecified: Secondary | ICD-10-CM

## 2018-11-14 DIAGNOSIS — N898 Other specified noninflammatory disorders of vagina: Secondary | ICD-10-CM

## 2018-11-14 DIAGNOSIS — H6123 Impacted cerumen, bilateral: Secondary | ICD-10-CM

## 2018-11-14 DIAGNOSIS — F3131 Bipolar disorder, current episode depressed, mild: Secondary | ICD-10-CM | POA: Diagnosis not present

## 2018-11-14 DIAGNOSIS — Z0000 Encounter for general adult medical examination without abnormal findings: Secondary | ICD-10-CM

## 2018-11-14 DIAGNOSIS — L732 Hidradenitis suppurativa: Secondary | ICD-10-CM | POA: Diagnosis not present

## 2018-11-14 DIAGNOSIS — Z124 Encounter for screening for malignant neoplasm of cervix: Secondary | ICD-10-CM

## 2018-11-14 LAB — POCT WET PREP (WET MOUNT)
Clue Cells Wet Prep Whiff POC: NEGATIVE
TRICHOMONAS WET PREP HPF POC: ABSENT

## 2018-11-14 MED ORDER — RISPERIDONE 1 MG PO TABS: 1.0000 mg | ORAL_TABLET | Freq: Two times a day (BID) | ORAL | 0 refills | Status: DC

## 2018-11-14 NOTE — Progress Notes (Signed)
Patient ID: Virginia Ballard, female   DOB: [birth year], 38 y.o.   MRN: [id number] Subjective:   Well Adult Visit/Annual Physical Exam  LYLAH Ballard is a 38 y.o. female and is here for a comprehensive physical exam. The patient reports problems - Vaginal odor for few days. Denies any discharge. She does not have menses on IUD/Mirena. She is sexually active and will like to get STD screening.  Hx of Bipolar.Off medication for more than 1 year. She used to be on meds and used to see a psychiatrist. She stopped everything since she went home [facility] [place]. Denies suicidal ideation. She did very well on Lamictal and Risperidone in the past and will like to get back on this medication. Endorsed feeling so hyper lately, gets irritable, and has issues with sleeping. She endorsed high energy on and off. Currently, she is at baseline.  Muffled hearing on and off x 1 month. No ear pain.  Social History   Socioeconomic History  . Marital status: Married    Spouse name: Not on file  . Number of children: Not on file  . Years of education: Not on file  . Highest education level: Not on file  Occupational History  . Not on file  Social Needs  . Financial resource strain: Not on file  . Food insecurity:    Worry: Not on file    Inability: Not on file  . Transportation needs:    Medical: Not on file    Non-medical: Not on file  Tobacco Use  . Smoking status: Current Every Day Smoker    Packs/day: 0.50    Years: 18.00    Pack years: 9.00    Types: Cigarettes  . Smokeless tobacco: Never Used  Substance and Sexual Activity  . Alcohol use: No    Comment: States only occasional use  . Drug use: No    Types: Marijuana    Comment: one blunt daily (THC)  . Sexual activity: Not Currently    Birth control/protection: I.U.D.  Lifestyle  . Physical activity:    Days per week: Not on file    Minutes per session: Not on file  . Stress: Not on file  Relationships  . Social connections:   Talks on phone: Not on file    Gets together: Not on file    Attends religious service: Not on file    Active member of club or organization: Not on file    Attends meetings of clubs or organizations: Not on file    Relationship status: Not on file  . Intimate partner violence:    Fear of current or ex partner: Not on file    Emotionally abused: Not on file    Physically abused: Not on file    Forced sexual activity: Not on file  Other Topics Concern  . Not on file  Social History Narrative  . Not on file   Health Maintenance  Topic Date Due  . PAP SMEAR-Modifier  12/17/2018  . TETANUS/TDAP  04/13/2021  . INFLUENZA VACCINE  Completed  . HIV Screening  Completed    The following portions of the patient's history were reviewed and updated as appropriate: allergies, current medications, past family history, past medical history, past social history, past surgical history and problem list.  Review of Systems Pertinent items noted in HPI and remainder of comprehensive ROS otherwise negative.   Objective:    BP 110/80   Temp 98.2 F (36.8 C) (Oral)  Wt 218 lb 12.8 oz (99.2 kg)   BMI 40.02 kg/m  General appearance: alert, cooperative and appears stated age Head: Normocephalic, without obvious abnormality, atraumatic Eyes: conjunctivae/corneas clear. PERRL, EOM's intact. Fundi benign. Ears: cerumen impaction B/L Throat: lips, mucosa, and tongue normal; teeth and gums normal Neck: no adenopathy, no carotid bruit, no JVD, supple, symmetrical, trachea midline and thyroid not enlarged, symmetric, no tenderness/mass/nodules Lungs: clear to auscultation bilaterally Heart: regular rate and rhythm, S1, S2 normal, no murmur, click, rub or gallop Abdomen: soft, non-tender; bowel sounds normal; no masses,  no organomegaly Pelvic: adnexae not palpable, cervix normal in appearance, external genitalia normal, no cervical motion tenderness, rectovaginal septum normal, uterus normal size,  shape, and consistency and normal appearing discharge Extremities: extremities normal, atraumatic, no cyanosis or edema Skin: Skin color, texture, turgor normal. No rashes or lesions or few nodules on her left armpit. No boils or inflamation.  Lymph nodes: Cervical, supraclavicular, and axillary nodes normal. Neurologic: Alert and oriented X 3, normal strength and tone. Normal symmetric reflexes. Normal coordination and gait      Office Visit from 11/14/2018 in [facility]  PHQ-9 Total Score  18      Assessment:    Healthy female exam.     Vaginitis/STD screen Bipolar 1 Muffled hearing  Plan:  1. Normal well exam. PAP due in 3-5 year. She opted to recheck today.     Age appropriate counseling completed.    Annual exam in 1 year.  2. Wet prep negative for BP. GC/Chlamydia pending. HIV and RPR pending. I will contact her with the test result.  3. Bipolar 1. Not suicidal and well appearing today. I offered [facility] counseling during this visit but she declined. I attempted to reach Dr. Kathi Ludwig whom she had seen in the past, but I was not successful with that.     I will start on Risperidone pending Psych f/u. I gave list of Psychiatrist to choose from. She also has Syed's phone number to call for appointment.    I placed referral as well.    Return precaution discussed.  4. Ear lavage completed. Still some wax in her ears. Ear curette used to remove wax after obtaining verbal consent. Procedure was well tolerated without complication. She could hear better after the procedure.    I recommended debrox as there were still some cerumen in her left ear, I could not reach.    F/U as needed. 5. Hydradenitis: Good skin hygiene discussed.

## 2018-11-14 NOTE — Patient Instructions (Addendum)
It was nice seeing you today. We will contact you with all test results not available during your visit. Please schedule an appointment with your psychiatrist as soon as possible. Also see your PCP in the next 1-2 weeks. Call if you have any question.  Looks like you say Dr. Kathi Ludwig in the past for Bipolar. Please contact their office for appointment at [phone] or call one of the offices I gave you.

## 2018-11-15 LAB — HIV ANTIBODY (ROUTINE TESTING W REFLEX): HIV Screen 4th Generation wRfx: NONREACTIVE

## 2018-11-15 LAB — RPR: RPR Ser Ql: NONREACTIVE

## 2018-11-16 ENCOUNTER — Telehealth: Payer: Self-pay | Admitting: Family Medicine

## 2018-11-16 LAB — CYTOLOGY - PAP
Chlamydia: NEGATIVE
Diagnosis: UNDETERMINED — AB
HPV: NOT DETECTED
Neisseria Gonorrhea: NEGATIVE

## 2018-11-16 NOTE — Telephone Encounter (Signed)
I discussed PAP result with her.  ASCUS with negative HPV. Per ASCCP Guideline, repeat co-testing in 3 years is recommended. She has no question at this time. F/U as needed.

## 2019-03-16 ENCOUNTER — Ambulatory Visit (INDEPENDENT_AMBULATORY_CARE_PROVIDER_SITE_OTHER): Payer: Self-pay | Admitting: Family Medicine

## 2019-03-16 ENCOUNTER — Other Ambulatory Visit: Payer: Self-pay

## 2019-03-16 DIAGNOSIS — M25562 Pain in left knee: Secondary | ICD-10-CM

## 2019-03-16 DIAGNOSIS — M25561 Pain in right knee: Secondary | ICD-10-CM

## 2019-03-16 DIAGNOSIS — R1031 Right lower quadrant pain: Secondary | ICD-10-CM

## 2019-03-16 MED ORDER — POLYETHYLENE GLYCOL 3350 17 G PO PACK: 17.0000 g | PACK | Freq: Every day | ORAL | 1 refills | Status: DC

## 2019-03-16 NOTE — Progress Notes (Signed)
   Subjective:   Patient ID: Virginia Ballard    DOB: Sep 02, 1981, 38 y.o. female   MRN: [id number]  CC: abdominal pain, knee pain  HPI: Virginia Ballard is a 38 y.o. female who presents to clinic today for the following issues.  Abdominal pain Vague abdominal pain started over the last 2-3 months.  Pain has remained the same and is not worsening, comes and goes and is a "heavy" feeling.  Has not eaten anything out of the ordinary lately.  Usually has about one bowel movement a day associated with chronic constipation. Pain is not related to food.  She denies a current pregnancy being possible.  No fever, chills, weight loss, nausea, vomiting or diarrhea.   Drinks a lot of water but feels she does not eat enough fibre.  Denies having blood in stool.    Knee pain Bilateral, has been going on for knees but recently has been worse.   Pain is achy and she feels as though her kneecaps are grinding.  She is not physically active and has not been on her feet a lot.  Worse toward end of the day.  Has not noticed any swelling or redness.  No history of fall or trauma.  Knee has not been locking or giving out.  Takes Tylenol for pain.   No numbness or tingling in her feet.   ROS: See HPI for pertinent ROS.    [place]: Pertinent past medical, surgical, family, and social history were reviewed and updated as appropriate. Smoking status reviewed.  Medications reviewed. Objective:  Vitals and nursing note reviewed.  General: 38 yo female, NAD HEENT: EOMI, MMM Neck: supple  CV: RRR no MRG  Lungs: CTAB, normal effort  Abdomen: soft, non-distended, mildly tender over RLQ, no rebound or guarding present, +bs  MSK: Knee- Normal inspection with no erythema or effusion or obvious bony abnormalities. No warmth or joint line tenderness.  +grinding of patella noted bilaterally.  ROM normal in flexion and extension and lower leg rotation. Ligaments with solid consistent endpoints including ACL, PCL, LCL, MCL.  Negative Mcmurray's and provocative meniscal tests. Non painful patellar compression. Patellar and quadriceps tendons unremarkable. Hamstring and quadriceps strength is normal bilaterally.   Skin: warm, dry Psych: appears very anxious  Assessment & Plan:   Abdominal pain Do not feel infectious etiology given timeline and absence of systemic symptoms. Clinical history c/w ?constipation predominant IBS although could consider other IBD vs peptic ulcer. Could also suspect gluten sensitivity although no associated diarrhea reported.  Abdominal exam largely normal with exception of vague RLQ pain.  Suspect may also have underlying psych component as she appeared very anxious throughout encounter.  Have prescribed trial of Miralax to help with bowel movements, advised increasing water intake.  If no improvement, consider further workup for other etiology.  Red flags and return precautions discussed.   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 Discussed this may also lead to patellofemoral pain. At home exercises and handout provided. Advised regular stretching, may try short trial of NSAID.  Return if symptoms worsen or do not improve.   Meds ordered this encounter  Medications  . polyethylene glycol (MIRALAX / GLYCOLAX) 17 g packet    Sig: Take 17 g by mouth daily.    Dispense:  14 each    Refill:  1   Lovenia Kim, MD [place], PGY-3 03/18/2019 6:16 PM

## 2019-03-18 ENCOUNTER — Encounter: Payer: Self-pay | Admitting: Family Medicine

## 2019-03-18 DIAGNOSIS — R109 Unspecified abdominal pain: Secondary | ICD-10-CM | POA: Insufficient documentation

## 2019-03-18 DIAGNOSIS — M25561 Pain in right knee: Secondary | ICD-10-CM | POA: Insufficient documentation

## 2019-03-18 NOTE — Assessment & Plan Note (Signed)
Do not feel infectious etiology given timeline and absence of systemic symptoms. Clinical history c/w ?constipation predominant IBS although could consider other IBD vs peptic ulcer. Could also suspect gluten sensitivity although no associated diarrhea reported.  Abdominal exam largely normal with exception of vague RLQ pain.  Suspect may also have underlying psych component as she appeared very anxious throughout encounter.  Have prescribed trial of Miralax to help with bowel movements, advised increasing water intake.  If no improvement, consider further workup for other etiology.  Red flags and return precautions discussed.

## 2019-03-18 NOTE — Assessment & Plan Note (Signed)
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 Discussed this may also lead to patellofemoral pain. At home exercises and handout provided. Advised regular stretching, may try short trial of NSAID.  Return if symptoms worsen or do not improve.

## 2019-03-19 ENCOUNTER — Telehealth (INDEPENDENT_AMBULATORY_CARE_PROVIDER_SITE_OTHER): Payer: Self-pay | Admitting: Family Medicine

## 2019-03-19 ENCOUNTER — Telehealth: Payer: Self-pay | Admitting: *Deleted

## 2019-03-19 ENCOUNTER — Other Ambulatory Visit: Payer: Self-pay

## 2019-03-19 DIAGNOSIS — Z20822 Contact with and (suspected) exposure to covid-19: Secondary | ICD-10-CM

## 2019-03-19 DIAGNOSIS — R0602 Shortness of breath: Secondary | ICD-10-CM

## 2019-03-19 NOTE — Progress Notes (Signed)
[facility] Tele[facility] Visit  Patient consented to have virtual visit. Method of visit: Video  Encounter participants: Patient: Virginia Ballard - located in car Provider: Opelika Bing - located at office  Chief Complaint: "muscle ache, joint pain, cough with shortness of breath"  HPI:  Has been short of breath for 14 days beginning with dry cough and developed SOB over last week which she thought was her asthma Severity: 5/10 Short of breath with: rest and activity New Medications: no Kidney problems: no Heart problems: no History of cancer: no  Symptoms Clear rhinorrhea and chest congestion Fever: yes, but does not have thermometer Sputum: occasional phlegm but mainly dry Wheezing or asthma: yes Leg swelling: yes, both legs, not a previous issue Chest Pain: no Immobility: no Stomach pain or black bowel movements: some abdominal discomfort Severe snoring or daytime sleepiness: no Weight loss: no, has gained 15 lbs over 1 month Patient is a current everyday smoker Works at hotel so unsure but 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  Constellation of URI symptoms with associated myalgias using concern for COVID-19 given current pandemic.  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  She does have prediabetes and questionable asthma without a controller.  Clinically well-appearing via tele-visit.  There is some question about lower extremity swelling which could be related to  more of a cardiac source, however she has no symptoms of ACS or history of heart disease.  I do feel she is a good candidate for the COVID-19 test.  She is approximately 2 weeks into her illness. - We will test for COVID-19 - Discussed preventative measures and return precautions    Time spent during visit with patient: 15 minutes

## 2019-03-19 NOTE — Telephone Encounter (Signed)
Patient scheduled for covid testing today @ 3:15 @ [facility]. Instructions given and order placed

## 2019-03-19 NOTE — Assessment & Plan Note (Signed)
Acute.  Constellation of URI symptoms with associated myalgias using concern for COVID-19 given current pandemic.  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  She does have prediabetes and questionable asthma without a controller.  Clinically well-appearing via tele-visit.  There is some question about lower extremity swelling which could be related to more of a cardiac source, however she has no symptoms of ACS or history of heart disease.  I do feel she is a good candidate for the COVID-19 test.  She is approximately 2 weeks into her illness. - We will test for COVID-19 - Discussed preventative measures and return precautions

## 2019-03-19 NOTE — Telephone Encounter (Signed)
-----   Message from Wallingford Bing, DO sent at 03/19/2019 10:16 AM EDT ----- Regarding: URI si/sx and myalgias COVID Drive-Up Test Referral Criteria  Patient age: 38 y.o.  Symptoms: Fever, Cough, Shortness of breath, Chills, and Muscle pain  Underlying Conditions: Diabetes  Is the patient a first responder? No  Does the patient live or work in a high risk or high density environment: No  Is the patient a COVID convalescent patient who is 14-28 days symptom-free and interested in donating plasma for use as a therapeutic product? No

## 2019-03-23 LAB — NOVEL CORONAVIRUS, NAA: SARS-CoV-2, NAA: NOT DETECTED

## 2019-04-05 ENCOUNTER — Other Ambulatory Visit: Payer: Self-pay

## 2019-04-05 ENCOUNTER — Ambulatory Visit (INDEPENDENT_AMBULATORY_CARE_PROVIDER_SITE_OTHER): Payer: Self-pay | Admitting: Family Medicine

## 2019-04-05 VITALS — BP 116/68 | HR 102

## 2019-04-05 DIAGNOSIS — F17200 Nicotine dependence, unspecified, uncomplicated: Secondary | ICD-10-CM

## 2019-04-05 DIAGNOSIS — L723 Sebaceous cyst: Secondary | ICD-10-CM

## 2019-04-05 NOTE — Progress Notes (Signed)
    Subjective:  Virginia Ballard is a 38 y.o. female who presents to the [facility] today with a chief complaint of boil on face.   HPI:  Patient reports that she has had a bump on the right side of her face for over a year. She says that it has never come to a head or popped. Reports that it is sometimes painful when she sleeps on that side of her head.   Objective:  Physical Exam: BP 116/68   Pulse (!) 102   SpO2 98%   Gen:NAD, resting comfortably CV: RRR with no murmurs appreciated Pulm: NWOB, CTAB with no crackles, wheezes, or rhonchi GI: Normal bowel sounds present. Soft, Nontender, Nondistended. MSK: no edema, cyanosis, or clubbing noted Skin: warm, dry Neuro: grossly normal, moves all extremities Psych: Normal affect and thought content  No results found for this or any previous visit (from the past 72 hour(s)).   Assessment/Plan:  No problem-specific Assessment & Plan notes found for this encounter.  Sebaceous cyst Lab Orders  No laboratory test(s) ordered today    No orders of the defined types were placed in this encounter.     Gifford Shave, MD  PGY-1, [facility]  04/05/19 3:03 PM

## 2019-04-05 NOTE — Progress Notes (Signed)
Patient ID: Virginia Ballard, female   DOB: Feb 10, 1981, 38 y.o.   MRN: [id number] CC: Facial skin bumps.  HPI: C/O a bump on the right side of her face which started years ago. She denies pain or redness. The bumps changes in size but seems to have reduced compared to before. She will like to get it out today.  ROS: As in the body of hx.  Physical Exam Vitals signs and nursing note reviewed.  Constitutional:      Appearance: Normal appearance.  HENT:     Head:   Neurological:     Mental Status: She is alert.      A/P: Sebaceous Cyst. I&D recommended. She consented verbally and in writing after discussing risk and benefit. I gave an option to observe but she prefer to get it removed.  PROCEDURE:  PRE-OP DIAGNOSIS: Sebaceous Cyst  POST-OP DIAGNOSIS: Same   PROCEDURE: incision and drainage of Cyst  Performing Physician: Andrena Mews    A timeout protocol was performed prior to initiating the procedure.  The area was prepared and draped in the usual, sterile manner. The site was anesthetized with 1% lidocaine without epinephrine. A linear incision along the local skin lines was made and the cheese-like material expressed. The area was explored thoroughly and sequestered pockets were opened. Bleeding was minimal.   Packing: None required   Followup: The patient tolerated the procedure well without complications.  Standard post-procedure care is explained and return precautions are given.  She asked about smoking cessation. We referred her to our tobacco clinic.

## 2019-04-05 NOTE — Patient Instructions (Signed)
Thank you for coming to see Virginia Ballard today! We excised a sebaceous cyst which may become painful over the next few hours. Please take tylenol as needed.   Please schedule smoking cessation with Dr. Valentina Lucks at checkout.   Epidermal Cyst Removal, Care After This sheet gives you information about how to care for yourself after your procedure. Your health care provider may also give you more specific instructions. If you have problems or questions, contact your health care provider. What can I expect after the procedure? After the procedure, it is common to have:  Soreness in the area where your cyst was removed.  Tightness or itchiness from the stitches (sutures) in your skin. Follow these instructions at home: Medicines  Take over-the-counter and prescription medicines only as told by your health care provider.  If you were prescribed an antibiotic medicine or ointment, take or apply it as told by your health care provider. Do not stop using the antibiotic even if you start to feel better. Incision care   Follow instructions from your health care provider about how to take care of your incision. Make sure you: ? Wash your hands with soap and water before you change your bandage (dressing). If soap and water are not available, use hand sanitizer. ? Change your dressing as told by your health care provider. ? Leave sutures, skin glue, or adhesive strips in place. These skin closures may need to stay in place for 1-2 weeks or longer. If adhesive strip edges start to loosen and curl up, you may trim the loose edges. Do not remove adhesive strips completely unless your health care provider tells you to do that.  Keep the dressingdry until your health care provider says that it can be removed.  After your dressing is off, check your incision area every day for signs of infection. Check for: ? Redness, swelling, or pain. ? Fluid or blood. ? Warmth. ? Pus or a bad smell. General instructions  Do  not take baths, swim, or use a hot tub until your health care provider approves. Ask your health care provider if you may take showers. You may only be allowed to take sponge baths.  Your health care provider may ask you to avoid contact sports or activities that take a lot of effort. Do not do anything that stretches or puts pressure on your incision.  You can return to your normal diet.  Keep all follow-up visits as told by your health care provider. This is important. Contact a health care provider if:  You have a fever.  You have redness, swelling, or pain in the incision area.  You have fluid or blood coming from your incision.  You have pus or a bad smell coming from your incision.  Your incision feels warm to the touch.  Your cyst grows back. Summary  After the procedure, it is common to have soreness in the area where your cyst was removed.  Take or apply over-the-counter and prescription medicines only as told by your health care provider.  Follow instructions from your health care provider about how to take care of your incision. This information is not intended to replace advice given to you by your health care provider. Make sure you discuss any questions you have with your health care provider. Document Released: 09/27/2014 Document Revised: 12/27/2017 Document Reviewed: 06/30/2017 Elsevier Patient Education  2020 [employer].

## 2019-04-24 ENCOUNTER — Ambulatory Visit: Payer: Medicaid Other | Admitting: Pharmacist

## 2019-05-08 ENCOUNTER — Ambulatory Visit (INDEPENDENT_AMBULATORY_CARE_PROVIDER_SITE_OTHER): Payer: Self-pay | Admitting: Pharmacist

## 2019-05-08 ENCOUNTER — Other Ambulatory Visit: Payer: Self-pay

## 2019-05-08 ENCOUNTER — Encounter: Payer: Self-pay | Admitting: Pharmacist

## 2019-05-08 DIAGNOSIS — F17200 Nicotine dependence, unspecified, uncomplicated: Secondary | ICD-10-CM

## 2019-05-08 MED ORDER — CHANTIX STARTING MONTH PAK 0.5 MG X 11 & 1 MG X 42 PO TABS: ORAL_TABLET | ORAL | 0 refills | Status: DC

## 2019-05-08 NOTE — Assessment & Plan Note (Signed)
Tobacco use disorder with low-moderate nicotine dependence of 23 years duration in a patient who is excellent candidate for success because of motivation to quit for her health and daughter.    -Initiated varenicline titration of 0.5 mg by mouth once daily with food x3 days, then 0.5 mg by mouth twice daily with food x4 days, then 1 mg by mouth twice daily with food thereafter will determine if patient can tolerate the 1 mg dose for continued therapy. Patient counseled on purpose, proper use, and potential adverse effects, including GI upset, and potential change in mood. Patient will let us know if she is unable to afford - as she only has Medicaid Family Planning.

## 2019-05-08 NOTE — Patient Instructions (Signed)
Nice to meet you today.     Please plan to start your Chantix as soon as you pick up the medication.   Follow-up by phone in 1 week (before 9:00 AM).   Best of Luck!

## 2019-05-08 NOTE — Progress Notes (Signed)
   S:  Patient arrives in good spirits, ambulating without assistance.   Patient arrives for evaluation/assistance with tobacco dependence.   Patient was referred and last seen by Dr. Gwendlyn Deutscher on 7/16.  She has not seen her PCP Dr. Antonietta Breach  Age when started using tobacco on a daily basis 15. Brand smoked Marlboro 76 (shorts). Number of cigarettes/day is 15-16. Estimated nicotine content per cigarette (mg) 0.75   Denies waking to smoke  Denies any problem with breathing/asthma (however, has purchased a "Primatene mist" within the last few months).  Denies routine use of Primatene mist.   Fagerstrom Score Question Scoring Patient Score  How soon after waking do you smoke your first cigarette? <5 mins (3) 5-30 mins (2) 31-60 mins (1) >60 mins (0) 3  Do you find it difficult NOT to smoke in places where you shouldn't? Yes(1) No (0)   Which cigarette would you most hate to give up? First one in AM (1)  Any other one (0)      How many cigarettes do you smoke/day? 10 or less (0) 11-20 (1) 21-30 (2) >30 (3) 1  Do you smoke more during the first few hours after waking? Yes (1) No (0) Yes (1)  Do you smoke if you are so ill you cannot get out of bed? Yes (1) No (0)    Total Score   Score interpretation: low 1-2, low-to-moderate 3-4, moderate 5-7, high >7  Most recent quit attempt when hospitalized.  Patches helped during hospitalization Longest time ever been tobacco free was 12 months during pregnancy and breastfeeding.  Medications used in past cessation efforts include: NRT patch and gum NRT patch - patient states a rash where patch was placed NRT gum - GI distress   Rates IMPORTANCE of quitting tobacco on 1-10 scale of 10. Rates CONFIDENCE of quitting tobacco on 1-10 scale of 8.  Most common triggers to use tobacco include family and work Physiological scientist to quit: better health and wants to be a role model for her teenage daughter   A/P: Tobacco use disorder with  low-moderate nicotine dependence of 23 years duration in a patient who is excellent candidate for success because of motivation to quit for her health and daughter.    -Initiated varenicline titration of 0.5 mg by mouth once daily with food x3 days, then 0.5 mg by mouth twice daily with food x4 days, then 1 mg by mouth twice daily with food thereafter will determine if patient can tolerate the 1 mg dose for continued therapy. Patient counseled on purpose, proper use, and potential adverse effects, including GI upset, and potential change in mood. Patient will let us know if she is unable to afford - as she only has Medicaid Family Planning.   Written information provided.  F/U phone call in one week.  Total time in face-to-face counseling 30 minutes.  Patient seen with Murlean Iba, PharmD Candidate and Lorel Monaco, PharmD PGY-1 Resident.

## 2019-05-08 NOTE — Progress Notes (Signed)
Reviewed: I agree with Dr. Koval's documentation and management. 

## 2019-05-15 ENCOUNTER — Telehealth: Payer: Self-pay | Admitting: Pharmacist

## 2019-05-15 MED ORDER — NICOTINE 21 MG/24HR TD PT24: 21.0000 mg | MEDICATED_PATCH | Freq: Every day | TRANSDERMAL | 1 refills | Status: AC

## 2019-05-15 NOTE — Telephone Encounter (Signed)
-----   Message from Leavy Cella, [facility] sent at 05/08/2019 12:17 PM EDT ----- Regarding: Tobacco Cessation Call prior to 9:00 AM

## 2019-05-15 NOTE — Telephone Encounter (Signed)
Noted and agree. 

## 2019-05-15 NOTE — Telephone Encounter (Signed)
Phone F/U Re tobacco cessation  Patient unable to afford Chantix and requested alternative (patches).   Patient reports smoking 15-18 cigs per day.  Requested highest dose patch.   New Rx for Nicotine 21 mg patch sent to pharmacy.   Phone f/u in 1 week.

## 2019-05-24 ENCOUNTER — Telehealth: Payer: Self-pay | Admitting: Pharmacist

## 2019-05-24 NOTE — Telephone Encounter (Signed)
Patient contacted in follow-up of smoking cessation attempt.   Patient reports decreasing use from 15-18 to 10 per day.  She denies starting patches at this time.  She reports that she will purchase those today AND start patches today.    Patient continues to express interest and motivation to quit smoking.   Plan to follow up in 2 weeks to assess progress.

## 2019-06-05 ENCOUNTER — Telehealth: Payer: Self-pay | Admitting: Pharmacist

## 2019-06-05 NOTE — Telephone Encounter (Signed)
-----   Message from Leavy Cella, [facility] sent at 05/24/2019  1:31 PM EDT ----- Regarding: tobacco - patch follow up

## 2019-06-05 NOTE — Telephone Encounter (Signed)
Patient contacted for follow-up of attempt to quit smoking.    Has Not started patches yet, pharmacy has them ordered.   She anticipates starting patches in the upcoming few days.   Has decreased smoking down to 7-10 per day.   Triggers identified include smoking with friends, when riding in the car and after meals.   Encouraged progress and identified that 2 weeks was an OK time to follow-up.   Plan phone follow-up in 2 weeks.

## 2019-06-29 ENCOUNTER — Telehealth: Payer: Self-pay | Admitting: Pharmacist

## 2019-06-29 NOTE — Telephone Encounter (Signed)
Attempted to call patient to discuss her smoking cessation therapy, but was unable to reach her. Will try to call her again next week.

## 2019-08-09 ENCOUNTER — Telehealth: Payer: Self-pay | Admitting: Pharmacist

## 2019-08-09 DIAGNOSIS — F17200 Nicotine dependence, unspecified, uncomplicated: Secondary | ICD-10-CM

## 2019-08-09 NOTE — Assessment & Plan Note (Signed)
Left brief message - requesting call back if she desires. Left direct phone #.    I do not plan additional follow-up at this time

## 2019-08-09 NOTE — Telephone Encounter (Signed)
Follow-up phone call attempted without success.   Left brief message - requesting call back if she desires. Left direct phone #.    I do not plan additional follow-up at this time.

## 2019-11-01 ENCOUNTER — Telehealth: Payer: Self-pay

## 2019-11-01 DIAGNOSIS — Z113 Encounter for screening for infections with a predominantly sexual mode of transmission: Secondary | ICD-10-CM | POA: Diagnosis not present

## 2019-11-01 NOTE — Telephone Encounter (Signed)
Pt calls nurse line requesting refill of Chantix. Per patient the medication had been placed on hold in pharmacy until she could afford it. Patient picked up first dose in January and would like a new rx refill for medication.  To PCP and Dr. Weston Settle, RN

## 2019-11-02 ENCOUNTER — Other Ambulatory Visit: Payer: Self-pay | Admitting: Family Medicine

## 2019-11-02 MED ORDER — CHANTIX STARTING MONTH PAK 0.5 MG X 11 & 1 MG X 42 PO TABS: ORAL_TABLET | ORAL | 0 refills | Status: DC

## 2019-11-02 MED ORDER — VARENICLINE TARTRATE 1 MG PO TABS: 1.0000 mg | ORAL_TABLET | Freq: Two times a day (BID) | ORAL | 3 refills | Status: AC

## 2019-11-02 NOTE — Telephone Encounter (Signed)
Noted and agree. 

## 2019-11-02 NOTE — Telephone Encounter (Signed)
Patient contacted in follow-up of tobacco cessation effort.    She reports starting Chantix mid-January and is now completing first month of therapy.  She denies any intolerance and is taking 1mg  (blue pills) BID with food.     She reports reduced intake to ~ 5 cigs per day.  She has a goal of cutting down to 1 pack EVERY TWO weeks by mid-March.   We discussed triggers including after meals which is the most difficult for her.   Agreed to send her refill. To [birth year]-23-1994.   I plan phone follow-up in 2 weeks.

## 2019-11-02 NOTE — Progress Notes (Signed)
Patient requesting Chantix for smoking cessation therapy

## 2019-11-13 ENCOUNTER — Telehealth: Payer: Self-pay | Admitting: Pharmacist

## 2019-11-13 NOTE — Telephone Encounter (Signed)
Contacted patient for reassessment of tobacco cessation progress.  States she has refill of varenicline and is tolerating this medication.   She continues to smoke each day but reports smoking "less of each cigarette".   Smoking less than 10 cigs total each day but does not have a number (maybe 5-6?)  She inquired about group classes and I shared that [facility] has some group classes she could consider.  I also encouraged use of the quit line for support.   Plan follow-up in 2-3 weeks.

## 2019-11-30 DIAGNOSIS — M5386 Other specified dorsopathies, lumbar region: Secondary | ICD-10-CM | POA: Diagnosis not present

## 2019-11-30 DIAGNOSIS — M5137 Other intervertebral disc degeneration, lumbosacral region: Secondary | ICD-10-CM | POA: Diagnosis not present

## 2019-11-30 DIAGNOSIS — M9905 Segmental and somatic dysfunction of pelvic region: Secondary | ICD-10-CM | POA: Diagnosis not present

## 2019-11-30 DIAGNOSIS — M9903 Segmental and somatic dysfunction of lumbar region: Secondary | ICD-10-CM | POA: Diagnosis not present

## 2019-12-03 DIAGNOSIS — M9905 Segmental and somatic dysfunction of pelvic region: Secondary | ICD-10-CM | POA: Diagnosis not present

## 2019-12-03 DIAGNOSIS — M5386 Other specified dorsopathies, lumbar region: Secondary | ICD-10-CM | POA: Diagnosis not present

## 2019-12-03 DIAGNOSIS — M5137 Other intervertebral disc degeneration, lumbosacral region: Secondary | ICD-10-CM | POA: Diagnosis not present

## 2019-12-03 DIAGNOSIS — M9903 Segmental and somatic dysfunction of lumbar region: Secondary | ICD-10-CM | POA: Diagnosis not present

## 2020-01-07 ENCOUNTER — Ambulatory Visit (INDEPENDENT_AMBULATORY_CARE_PROVIDER_SITE_OTHER): Payer: Self-pay | Admitting: Family Medicine

## 2020-01-07 ENCOUNTER — Other Ambulatory Visit (HOSPITAL_COMMUNITY)
Admission: RE | Admit: 2020-01-07 | Discharge: 2020-01-07 | Disposition: A | Discharge status: Home / Self Care | Payer: Medicaid Other | Source: Ambulatory Visit | Attending: Family Medicine | Admitting: Family Medicine

## 2020-01-07 ENCOUNTER — Other Ambulatory Visit: Payer: Self-pay

## 2020-01-07 VITALS — BP 122/80 | HR 84 | Ht 66.0 in | Wt 197.4 lb

## 2020-01-07 DIAGNOSIS — Z113 Encounter for screening for infections with a predominantly sexual mode of transmission: Secondary | ICD-10-CM | POA: Diagnosis not present

## 2020-01-07 DIAGNOSIS — N898 Other specified noninflammatory disorders of vagina: Secondary | ICD-10-CM | POA: Insufficient documentation

## 2020-01-07 DIAGNOSIS — Z114 Encounter for screening for human immunodeficiency virus [HIV]: Secondary | ICD-10-CM | POA: Diagnosis not present

## 2020-01-07 LAB — POCT WET PREP (WET MOUNT)
Clue Cells Wet Prep Whiff POC: NEGATIVE
Trichomonas Wet Prep HPF POC: ABSENT

## 2020-01-08 ENCOUNTER — Other Ambulatory Visit: Payer: Self-pay | Admitting: Family Medicine

## 2020-01-08 DIAGNOSIS — N760 Acute vaginitis: Secondary | ICD-10-CM

## 2020-01-08 DIAGNOSIS — B9689 Other specified bacterial agents as the cause of diseases classified elsewhere: Secondary | ICD-10-CM

## 2020-01-08 LAB — HIV ANTIBODY (ROUTINE TESTING W REFLEX): HIV Screen 4th Generation wRfx: NONREACTIVE

## 2020-01-08 MED ORDER — METRONIDAZOLE 500 MG PO TABS: 500.0000 mg | ORAL_TABLET | Freq: Two times a day (BID) | ORAL | 0 refills | Status: DC

## 2020-01-09 LAB — CERVICOVAGINAL ANCILLARY ONLY
Chlamydia: NEGATIVE
Comment: NEGATIVE
Comment: NORMAL
Neisseria Gonorrhea: NEGATIVE

## 2020-01-10 DIAGNOSIS — Z113 Encounter for screening for infections with a predominantly sexual mode of transmission: Secondary | ICD-10-CM | POA: Insufficient documentation

## 2020-01-10 NOTE — Assessment & Plan Note (Signed)
Testing negative as of writing this note

## 2020-01-10 NOTE — Patient Instructions (Signed)
Patient declined AVS 

## 2020-01-10 NOTE — Progress Notes (Signed)
    SUBJECTIVE:   CHIEF COMPLAINT / HPI:   Patient claiming of discharge, no dysuria, no abdominal pain.  Says she has no lesions or sores, she is safe at home, she is sexually active with 1 person and "just wants to be sure ".  Has Mirena for birth control  PERTINENT  PMH / PSH:   OBJECTIVE:   BP 122/80   Pulse 84   Ht 5\' 6"  (1.676 m)   Wt 197 lb 6.4 oz (89.5 kg)   SpO2 100%   BMI 31.86 kg/m   General: Pleasant alert, no distress GU exam:*Performed entirely with CMA Shelly in the room*there was white discharge found consistent with potential yeast versus bacterial vaginitis, no pathological lesions visualized or bleeding.  No concerning abnormal pain response to exam  ASSESSMENT/PLAN:   Screen for STD (sexually transmitted disease) Testing negative as of writing this note  Discharge of vagina Swab positive for bacterial vaginosis, patient sent message in my chart in order metronidazole     , DO [facility]

## 2020-01-10 NOTE — Assessment & Plan Note (Signed)
Swab positive for bacterial vaginosis, patient sent message in my chart in order metronidazole

## 2020-01-29 ENCOUNTER — Ambulatory Visit: Payer: Medicaid Other | Admitting: Family Medicine

## 2020-04-21 ENCOUNTER — Other Ambulatory Visit (HOSPITAL_COMMUNITY)
Admission: RE | Admit: 2020-04-21 | Discharge: 2020-04-21 | Disposition: A | Discharge status: Home / Self Care | Payer: BC Managed Care – PPO | Source: Ambulatory Visit | Attending: Family Medicine | Admitting: Family Medicine

## 2020-04-21 ENCOUNTER — Ambulatory Visit (INDEPENDENT_AMBULATORY_CARE_PROVIDER_SITE_OTHER): Payer: BC Managed Care – PPO | Admitting: Family Medicine

## 2020-04-21 ENCOUNTER — Other Ambulatory Visit: Payer: Self-pay

## 2020-04-21 VITALS — BP 114/68 | HR 88 | Wt 191.0 lb

## 2020-04-21 DIAGNOSIS — L732 Hidradenitis suppurativa: Secondary | ICD-10-CM

## 2020-04-21 DIAGNOSIS — B379 Candidiasis, unspecified: Secondary | ICD-10-CM | POA: Diagnosis not present

## 2020-04-21 DIAGNOSIS — Z113 Encounter for screening for infections with a predominantly sexual mode of transmission: Secondary | ICD-10-CM | POA: Diagnosis not present

## 2020-04-21 LAB — POCT WET PREP (WET MOUNT)
Clue Cells Wet Prep Whiff POC: POSITIVE
Trichomonas Wet Prep HPF POC: ABSENT

## 2020-04-21 MED ORDER — FLUCONAZOLE 150 MG PO TABS: 150.0000 mg | ORAL_TABLET | Freq: Once | ORAL | 0 refills | Status: AC

## 2020-04-21 MED ORDER — DOXYCYCLINE HYCLATE 100 MG PO TABS: 100.0000 mg | ORAL_TABLET | Freq: Two times a day (BID) | ORAL | 0 refills | Status: AC

## 2020-04-21 NOTE — Progress Notes (Signed)
   SUBJECTIVE:   CHIEF COMPLAINT / HPI:   Chief Complaint  Patient presents with  . Vaginal Discharge     Virginia Ballard is a 39 y.o. female here for skin lesion and to be checked for STIs.   STI check - recently became sexually with new female partner 3 weeks ago and wants to be checked for STIs. Partner is not having any symptoms  - Symptoms:  - Medications tried: none  - Sexually active with one female partner - Last sexual encounter: 2 days ago  - Contraception: IUD due to be replaced June 2022 (placed June 2017)  Symptoms - Abnormal vaginal discharge: thick, cheesy, white discharge  - Missed period: No LMP recorded. (Menstrual status: IUD). - Fever:  - Abdominal/Pelvic pain: yes - Vaginal bleeding: no - Pain during sex: no  - Rash: yes on vulva and inner thigh  - Douches occasionally      ROS see HPI      PERTINENT  PMH / PSH: reviewed and updated as appropriate   OBJECTIVE:   BP 114/68   Pulse 88   Wt 191 lb (86.6 kg)   SpO2 97%   BMI 30.83 kg/m   GEN: well appearing female in no acute distress  CVS: well perfused  RESP: speaking in full sentences without pause  ABD: soft, non-tender, non-distended, no palpable masses  Pelvic exam: boils on right upper and lower labia majora and scattered diffusely on inner thighs, VAGINA and CERVIX: lesions absent, cervical discharge present - white and creamy, DNA probe for chlamydia and GC obtained, ADNEXA: normal adnexa in size, nontender and no masses, WET MOUNT done - results: pending, exam chaperoned by CMA.    ASSESSMENT/PLAN:   Hidradenitis suppurativa Exam consistent with HS.  Will treat with Doxycycline for 7 days.  Will send Rx for Fluconazole in case of antibiotic induced yeast infection.   Screen for STD (sexually transmitted disease) HIV, GC, CT, RPR.  Wet prep pending.   - Will send Mychart message with results.      Katha Cabal, DO  PGY-2, [facility] 04/21/2020

## 2020-04-21 NOTE — Assessment & Plan Note (Addendum)
Exam consistent with HS.  Will treat with Doxycycline for 7 days.  Will send Rx for Fluconazole in case of antibiotic induced yeast infection.

## 2020-04-21 NOTE — Assessment & Plan Note (Signed)
HIV, GC, CT, RPR.  Wet prep pending.   - Will send Mychart message with results.

## 2020-04-22 ENCOUNTER — Telehealth: Payer: Self-pay | Admitting: Family Medicine

## 2020-04-22 DIAGNOSIS — Z20822 Contact with and (suspected) exposure to covid-19: Secondary | ICD-10-CM | POA: Diagnosis not present

## 2020-04-22 LAB — CERVICOVAGINAL ANCILLARY ONLY
Chlamydia: NEGATIVE
Comment: NEGATIVE
Comment: NORMAL
Neisseria Gonorrhea: NEGATIVE

## 2020-04-22 LAB — HIV ANTIBODY (ROUTINE TESTING W REFLEX): HIV Screen 4th Generation wRfx: NONREACTIVE

## 2020-04-22 LAB — RPR: RPR Ser Ql: NONREACTIVE

## 2020-04-22 MED ORDER — METRONIDAZOLE 500 MG PO TABS
500.0000 mg | ORAL_TABLET | Freq: Two times a day (BID) | ORAL | 0 refills | Status: AC
Start: 2020-04-22 — End: 2020-04-29

## 2020-04-22 NOTE — Telephone Encounter (Signed)
Left VM regarding wet prep results. She has BV and needs metronidazole. I will send this rx into the pharmacy incase she calls back. Would recommend to abstain from sexual activity and ETOH until infection clears up.

## 2020-04-22 NOTE — Telephone Encounter (Signed)
Spoke with pt. She did speak with the doctor and aware of the message and understands. Aquilla Solian, CMA

## 2020-04-23 ENCOUNTER — Encounter: Payer: Self-pay | Admitting: Family Medicine

## 2020-05-13 ENCOUNTER — Telehealth: Payer: Self-pay

## 2020-05-13 NOTE — Telephone Encounter (Signed)
Error

## 2020-05-15 ENCOUNTER — Other Ambulatory Visit: Payer: Self-pay

## 2020-05-15 ENCOUNTER — Ambulatory Visit (INDEPENDENT_AMBULATORY_CARE_PROVIDER_SITE_OTHER): Payer: BC Managed Care – PPO | Admitting: Podiatry

## 2020-05-15 DIAGNOSIS — B351 Tinea unguium: Secondary | ICD-10-CM | POA: Diagnosis not present

## 2020-05-15 DIAGNOSIS — M205X9 Other deformities of toe(s) (acquired), unspecified foot: Secondary | ICD-10-CM | POA: Diagnosis not present

## 2020-05-15 DIAGNOSIS — M79674 Pain in right toe(s): Secondary | ICD-10-CM | POA: Diagnosis not present

## 2020-05-15 DIAGNOSIS — M79675 Pain in left toe(s): Secondary | ICD-10-CM | POA: Diagnosis not present

## 2020-05-15 DIAGNOSIS — L600 Ingrowing nail: Secondary | ICD-10-CM

## 2020-05-15 DIAGNOSIS — L84 Corns and callosities: Secondary | ICD-10-CM

## 2020-05-15 NOTE — Patient Instructions (Signed)
I have ordered a medication for you that will come from [facility] in [place]. They should be calling you to verify insurance and will mail the medication to you. If you live close by then you can go by their pharmacy to pick up the medication. Their phone number is [phone]. If you do not hear from them in the next few days, please give us a call at [phone].   

## 2020-05-16 NOTE — Progress Notes (Signed)
Subjective:   Patient ID: Virginia Ballard, female   DOB: 39 y.o.   MRN: [id number]   HPI 39 year old female presents the office today for concerns of ingrown toenails both of her big toes as well as calluses of both fifth digits.  Also she has discoloration to her nails that she wants to discuss.  She has had no recent treatment for any new issues and she denies any recent injury.  No open lesions.  She has no other concerns today.   Review of Systems  All other systems reviewed and are negative.  Past Medical History:  Diagnosis Date  . Bipolar disorder (HCC)    diagnosed >2 yrs ago  . Depression    diagnosed >2 yrs ago  . Medical history non-contributory     Past Surgical History:  Procedure Laterality Date  . NO PAST SURGERIES       Current Outpatient Medications:  .  fluconazole (DIFLUCAN) 150 MG tablet, Take 150 mg by mouth once., Disp: , Rfl:  .  nicotine (NICODERM CQ - DOSED IN MG/24 HOURS) 21 mg/24hr patch, Place 1 patch (21 mg total) onto the skin daily. (Patient not taking: Reported on 11/02/2019), Disp: 28 patch, Rfl: 1 .  varenicline (CHANTIX) 1 MG tablet, Take 1 tablet (1 mg total) by mouth 2 (two) times daily with a meal., Disp: 60 tablet, Rfl: 3  No Known Allergies       Objective:  Physical Exam  General: AAO x3, NAD  Dermatological: Nails are mildly hypertrophic, dystrophic with yellow-brown discoloration.  No hyperpigmentation.  There is no open lesions.  Hyperkeratotic lesions on the dorsal lateral aspect of the fifth digits bilaterally without any underlying ulceration drainage or any signs of infection.  Incurvation present to both the hallux toenails in both the medial lateral aspect with tenderness palpation of the distal aspects there is no edema, erythema or any clinical signs of infection noted today.  Vascular: Dorsalis Pedis artery and Posterior Tibial artery pedal pulses are 2/4 bilateral with immedate capillary fill time. There is no pain with  calf compression, swelling, warmth, erythema.   Neruologic: Grossly intact via light touch bilateral.   Musculoskeletal: Adductovarus is present bilateral fifth toes.  Muscular strength 5/5 in all groups tested bilateral.  Gait: Unassisted, Nonantalgic.      Assessment:   39 year old female with ingrown toenails, onychomycosis, adductovarus resulting hyperkeratotic lesions     Plan:  -Treatment options discussed including all alternatives, risks, and complications -Etiology of symptoms were discussed -We discussed partial nail avulsions with chemical matrixectomy but she wants to work on this.  I debrided the hallux toenails remove the symptomatic portion of ingrown nails with any complications or bleeding.  Dispensed offloading pads with his fifth toe there is debrided the hyperkeratotic lesions without any complications or bleeding.  We discussed treatment options for nail fungus and she wants to hold off on oral medication for now.  Ordered a compound cream today through [employer].  Vivi Barrack DPM

## 2020-06-10 ENCOUNTER — Telehealth: Payer: Self-pay | Admitting: Podiatry

## 2020-06-10 NOTE — Telephone Encounter (Signed)
Pt stated that a prescription was supposed to be sent into [place] but the pharmacy said they do not have it.

## 2020-06-10 NOTE — Telephone Encounter (Signed)
Virginia Ballard- can you please call her back to let her know that I have refaxed the medication prescription to [facility]. They should call her and if she doesn't hear by the end of the week to let me know. Thanks.

## 2020-07-01 ENCOUNTER — Ambulatory Visit (HOSPITAL_COMMUNITY)
Admission: EM | Admit: 2020-07-01 | Discharge: 2020-07-01 | Disposition: A | Discharge status: Home / Self Care | Payer: BC Managed Care – PPO | Attending: Registered Nurse | Admitting: Registered Nurse

## 2020-07-01 ENCOUNTER — Other Ambulatory Visit: Payer: Self-pay

## 2020-07-01 ENCOUNTER — Telehealth (HOSPITAL_COMMUNITY): Payer: Self-pay | Admitting: Psychiatry

## 2020-07-01 ENCOUNTER — Encounter (HOSPITAL_COMMUNITY): Payer: Self-pay | Admitting: Registered Nurse

## 2020-07-01 DIAGNOSIS — F32A Depression, unspecified: Secondary | ICD-10-CM | POA: Diagnosis not present

## 2020-07-01 DIAGNOSIS — G4700 Insomnia, unspecified: Secondary | ICD-10-CM | POA: Diagnosis not present

## 2020-07-01 DIAGNOSIS — F319 Bipolar disorder, unspecified: Secondary | ICD-10-CM | POA: Diagnosis present

## 2020-07-01 DIAGNOSIS — F4323 Adjustment disorder with mixed anxiety and depressed mood: Secondary | ICD-10-CM | POA: Diagnosis present

## 2020-07-01 MED ORDER — ARIPIPRAZOLE 5 MG PO TABS: 5.0000 mg | ORAL_TABLET | Freq: Once | ORAL | 0 refills | Status: DC

## 2020-07-01 MED ORDER — ARIPIPRAZOLE 5 MG PO TABS: 5.0000 mg | ORAL_TABLET | Freq: Once | ORAL | Status: DC

## 2020-07-01 NOTE — BH Assessment (Signed)
Comprehensive Clinical Assessment (CCA) Screening, Triage and Referral Note  07/01/2020 Virginia Ballard [id number]   Patient is a 39 y.o. female with a history of Bipolar Disorder, currently untreated, who presents to [facility] for assessment.  Patient presents with mood lability, vacillating between elevated laughter and crying.  She shares that she is overwhelmed and tired of life, as she is experiencing financial strain due to "too much debt."  She is tearful as she states she needs a job, one that will pay her what she should be paid.  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  She has tried to look for a second job, however her current employer is changing her schedule without discussing this with patient.  This along with ongoing THC use have been barriers to seeking work.  She reports THC use to "self-medicate," as she doesn't feel any medications she has tried in the past have been helpful.  She feels psychotropic medications have mostly made her feel "isolated, separated and like a zombie."  Patient denies SI, HI and AVH.  She is sleeping 4 hours or less per night, as she gets home and has to be present for her life and the things she wants to do outside of work.  She then finds herself having to "make myself" try to sleep knowing she has to go to work in a few hours.  Patient is open to outpatient treatment recommendations and she is open to trying recommended medications.    Disposition: Per Assunta Found, NP patient will be referred to [facility] IOP program.  Contact info for Jeri Modena will be included in the AVS to be provided to pt upon discharge. Patient will also be provided with an Rx for abilify.   Visit Diagnosis: No diagnosis found.  Patient Reported Information How did you hear about [place]? Self   Referral name: No data recorded  Referral phone number: No data recorded Whom do you see for routine medical problems? I  don't have a doctor   Practice/Facility Name: No data recorded  Practice/Facility Phone Number: No data recorded  Name of Contact: No data recorded  Contact Number: No data recorded  Contact Fax Number: No data recorded  Prescriber Name: No data recorded  Prescriber Address (if known): No data recorded What Is the Reason for Your Visit/Call Today? Patient presents reporting she is feeling overwhelmed with life and needing some relief.  She is open to outpt treatment options.  How Long Has This Been Causing You Problems? 1 wk - 1 month  Have You Recently Been in Any Inpatient Treatment (Hospital/Detox/Crisis Center/28-Day Program)? No   Name/Location of Program/Hospital:No data recorded  How Long Were You There? No data recorded  When Were You Discharged? No data recorded Have You Ever Received Services From [facility] Before? Yes   Who Do You See at [facility]? No data recorded Have You Recently Had Any Thoughts About Hurting Yourself? No   Are You Planning to Commit Suicide/Harm Yourself At This time?  No  Have you Recently Had Thoughts About Hurting Someone Karolee Ohs? No   Explanation: No data recorded Have You Used Any Alcohol or Drugs in the Past 24 Hours? No   How Long Ago Did You Use Drugs or Alcohol?  No data recorded  What Did You Use and How Much? No data recorded What Do You Feel Would Help You the Most Today? Therapy;Medication  Do You Currently  Have a Therapist/Psychiatrist? No   Name of Therapist/Psychiatrist: No data recorded  Have You Been Recently Discharged From Any Office Practice or Programs? No   Explanation of Discharge From Practice/Program:  No data recorded    CCA Screening Triage Referral Assessment Type of Contact: Face-to-Face   Is this Initial or Reassessment? No data recorded  Date Telepsych consult ordered in [facility]:  No data recorded  Time Telepsych consult ordered in [facility]:  No data recorded Patient Reported Information Reviewed?  Yes   Patient Left Without Being Seen? No data recorded  Reason for Not Completing Assessment: No data recorded Collateral Involvement: N/A  Does Patient Have a Court Appointed Legal Guardian? No data recorded  Name and Contact of Legal Guardian:  No data recorded If Minor and Not Living with Parent(s), Who has Custody? No data recorded Is CPS involved or ever been involved? Never  Is APS involved or ever been involved? Never  Patient Determined To Be At Risk for Harm To Self or Others Based on Review of Patient Reported Information or Presenting Complaint? No   Method: No data recorded  Availability of Means: No data recorded  Intent: No data recorded  Notification Required: No data recorded  Additional Information for Danger to Others Potential:  No data recorded  Additional Comments for Danger to Others Potential:  No data recorded  Are There Guns or Other Weapons in Your Home?  No data recorded   Types of Guns/Weapons: No data recorded   Are These Weapons Safely Secured?                              No data recorded   Who Could Verify You Are Able To Have These Secured:    No data recorded Do You Have any Outstanding Charges, Pending Court Dates, Parole/Probation? No data recorded Contacted To Inform of Risk of Harm To Self or Others: No data recorded Location of Assessment: [facility]  Does Patient Present under Involuntary Commitment? No   IVC Papers Initial File Date: No data recorded  [place] of Residence: [place]  Patient Currently Receiving the Following Services: Not Receiving Services   Determination of Need: Routine (7 days)   Options For Referral: Intensive Outpatient Therapy   Yetta Glassman

## 2020-07-01 NOTE — ED Notes (Signed)
Pt belongings returned upon discharge. Reviewed AVS , Pt verbalized understanding. Safety maintained.

## 2020-07-01 NOTE — ED Provider Notes (Signed)
Behavioral Health Urgent Care Medical Screening Exam  Patient Name: Virginia Ballard MRN: [id number] Date of Evaluation: 07/01/20 Chief Complaint:   Diagnosis:  Final diagnoses:  None    History of Present illness: Virginia Ballard is a 39 y.o. female patient presents as a walk in at [facility] with complaints of feeling overwhelmed, worsening depression and anxiety.   Virginia Ballard, 39 y.o., female patient seen face to face by this provider, consulted with Dr. Lucianne Muss; and chart reviewed on 07/01/20.  On evaluation Virginia Ballard reports she is feeling overwhelmed related to her job, financials, feeling that she is unable to give her daughter the things she needs, and not having a support system who is there for her.  Patient venting about her work hours, not having time to rest,  And unable to provide her daughter the things that are needed.  States she has been self medicating with alcohol and "weed".  Patient reports prior psychiatric history (inpatient/outpatient services) multiple psychotropic medications that did not work for her.  Patient also reports a history of self-harm when she was younger but has done since the birth of her daughter who is now a senior in high school.  Patient looking for medication management and outpatient psychiatric services  During evaluation Virginia Ballard is alert/oriented x 4; calm/cooperative; and mood is congruent with affect.  She does not appear to be responding to internal/external stimuli or delusional thoughts.  Patient denies suicidal/self-harm/homicidal ideation, psychosis, and paranoia.  Patient answered question appropriately.     Psychiatric Specialty Exam  Presentation  General Appearance:Appropriate for Environment;Casual;Well Groomed  Eye Contact:Good  Speech:Clear and Coherent;Normal Rate  Speech Volume:Normal  Handedness:Right   Mood and Affect  Mood:Anxious;Depressed;Labile  Affect:Appropriate;Full Range   Thought Process   Thought Processes:Coherent;Goal Directed  Descriptions of Associations:Tangential  Orientation:Full (Time, Place and Person)  Thought Content:Rumination;WDL  Hallucinations:None  Ideas of Reference:None  Suicidal Thoughts:Yes, Passive (Thoghts of not being here but would never attempt related to living for her daughter and being there for her) Without Intent;Without Plan  Homicidal Thoughts:No   Sensorium  Memory:Immediate Good;Recent Good;Remote Good  Judgment:Intact  Insight:Good;Present   Executive Functions  Concentration:Good  Attention Span:Good  Recall:Good  Fund of Knowledge:Good  Language:Good   Psychomotor Activity  Psychomotor Activity:Normal   Assets  Assets:Communication Skills;Desire for Improvement;Housing;Social Support;Physical Health   Sleep  Sleep:Fair  Number of hours: No data recorded  Physical Exam: Physical Exam Constitutional:      Appearance: Normal appearance.  HENT:     Head: Normocephalic and atraumatic.  Cardiovascular:     Rate and Rhythm: Normal rate and regular rhythm.  Pulmonary:     Effort: Pulmonary effort is normal.     Breath sounds: Normal breath sounds.  Musculoskeletal:        General: Normal range of motion.     Cervical back: Normal range of motion and neck supple.  Skin:    General: Skin is warm and dry.     Findings: No lesion.  Neurological:     General: No focal deficit present.     Mental Status: She is alert and oriented to person, place, and time.     Gait: Gait normal.  Psychiatric:        Attention and Perception: Attention and perception normal. She does not perceive visual hallucinations.        Mood and Affect: Mood is anxious. Affect is labile.    Review of Systems  Psychiatric/Behavioral: Positive  for depression. Negative for hallucinations and memory loss. Substance abuse: Passive, no intent, no plan. Suicidal ideas: Daily use of THC and alcohol 4 times week. The patient is  nervous/anxious and has insomnia.   All other systems reviewed and are negative.  Blood pressure 130/87, pulse 92, temperature 99 F (37.2 C), temperature source Oral, resp. rate 16, height 5' 7.5" (1.715 m), weight 189 lb 12 oz (86.1 kg), SpO2 100 %. Body mass index is 29.28 kg/m.  Musculoskeletal: Strength & Muscle Tone: within normal limits Gait & Station: normal Patient leans: N/A   [facility] MSE Discharge Disposition for Follow up and Recommendations: Based on my evaluation the patient does not appear to have an emergency medical condition and can be discharged with resources and follow up care in outpatient services for Medication Management, Substance Abuse Intensive Outpatient Program and Group Therapy    Follow-up Information    [facility].   Specialty: Behavioral Health Why: Will call to set up an appointment Contact information: [address] [place] [address] [phone]               Discharge Instructions     You have been referred to [facility] Intensive Outpatient program (IOP).  Jeri Modena will be calling you to schedule a start date.  If you haven't heard from [place] by 10 AM tomorrow, you may call her at [phone].           Virginia Berko, NP 07/01/2020, 12:10 PM

## 2020-07-01 NOTE — Addendum Note (Signed)
Encounter addended by: Talmage Nap, NP on: 07/01/2020 3:11 PM  Actions taken: Order Reconciliation Section accessed

## 2020-07-01 NOTE — Discharge Instructions (Addendum)
You have been referred to [facility] Intensive Outpatient program (IOP).  Jeri Modena will be calling you to schedule a start date.  If you haven't heard from [place] by 10 AM tomorrow, you may call her at [phone].

## 2020-07-01 NOTE — ED Notes (Signed)
LOCKER 27 

## 2020-07-01 NOTE — Telephone Encounter (Signed)
DDenice Bors, NP and Franne Grip (TTS) referred pt to [facility]-IOP.  A:  Placed call to orient pt and provide her with a start date.  Encouraged pt to verify her insurance benefits.  Pt will meet with Fulton Reek, LCSW, LCAS on 07-04-20 @ 9 a.m (encouraged pt to arrive at 8:30 a.m, wearing a mask and bring insurance card).  R:  Pt receptive.

## 2020-07-04 ENCOUNTER — Other Ambulatory Visit: Payer: Self-pay

## 2020-07-04 ENCOUNTER — Ambulatory Visit (INDEPENDENT_AMBULATORY_CARE_PROVIDER_SITE_OTHER): Payer: BC Managed Care – PPO | Admitting: Licensed Clinical Social Worker

## 2020-07-04 DIAGNOSIS — F1010 Alcohol abuse, uncomplicated: Secondary | ICD-10-CM

## 2020-07-04 DIAGNOSIS — F1210 Cannabis abuse, uncomplicated: Secondary | ICD-10-CM

## 2020-07-04 DIAGNOSIS — F1994 Other psychoactive substance use, unspecified with psychoactive substance-induced mood disorder: Secondary | ICD-10-CM
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
## 2020-07-08 ENCOUNTER — Ambulatory Visit (HOSPITAL_COMMUNITY): Payer: BC Managed Care – PPO | Admitting: Licensed Clinical Social Worker

## 2020-07-09 ENCOUNTER — Other Ambulatory Visit: Payer: Self-pay

## 2020-07-09 ENCOUNTER — Ambulatory Visit (INDEPENDENT_AMBULATORY_CARE_PROVIDER_SITE_OTHER): Payer: BC Managed Care – PPO | Admitting: Licensed Clinical Social Worker

## 2020-07-09 DIAGNOSIS — F1994 Other psychoactive substance use, unspecified with psychoactive substance-induced mood disorder: Secondary | ICD-10-CM | POA: Diagnosis not present

## 2020-07-09 DIAGNOSIS — F1220 Cannabis dependence, uncomplicated: Secondary | ICD-10-CM | POA: Diagnosis not present

## 2020-07-09 DIAGNOSIS — F1029 Alcohol dependence with unspecified alcohol-induced disorder: Secondary | ICD-10-CM | POA: Diagnosis not present
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
## 2020-07-16 ENCOUNTER — Other Ambulatory Visit: Payer: Self-pay

## 2020-07-16 ENCOUNTER — Ambulatory Visit (INDEPENDENT_AMBULATORY_CARE_PROVIDER_SITE_OTHER): Payer: BC Managed Care – PPO | Admitting: Licensed Clinical Social Worker

## 2020-07-16 DIAGNOSIS — F1029 Alcohol dependence with unspecified alcohol-induced disorder: Secondary | ICD-10-CM

## 2020-07-16 DIAGNOSIS — F1994 Other psychoactive substance use, unspecified with psychoactive substance-induced mood disorder: Secondary | ICD-10-CM | POA: Diagnosis not present

## 2020-07-16 DIAGNOSIS — F1220 Cannabis dependence, uncomplicated: Secondary | ICD-10-CM

## 2020-07-16 NOTE — Progress Notes (Signed)
   THERAPIST PROGRESS NOTE  Session Time: 11am-11:45am  Participation Level: Active  Behavioral Response: CasualAlertEuthymic and hopeful  Type of Therapy: Individual Therapy  Treatment Goals addressed: Coping and Diagnosis: substance abuse  Interventions: CBT, Motivational Interviewing, Supportive and Other: goal: increase use of healthy coping skills in place of substance use, identify distorted thinking leading to mood symtpoms  Summary: Virginia Ballard is a 39 y.o. female who presents with polysubstance use and substance induced mood disorder. Clinician processed worries about disappointing others with boundary setting but was able to voice understanding of importance to meet prioritized needs. Client agreed a visual schedule to including time for all needs vs free time for others would be helpful. Client is receptive to psycho-education and demonstrated changing thoughts and words was effective on feelings and options for behaviors. Client shows progress toward goals AEB not using alcohol as immediate response to stressors. Client needs continued progress on assertive communication and maintaining healthy boundaries.  Suicidal/Homicidal: Nowithout intent/plan  Therapist Response: Clinician checked in with client, assessing SI/HI/psychosis and overall level of functioning. Clinician reviewed with client changes in substance use. Clinician explored with client needs vs wants and scheduling necessary activities before over-extending self to meet others needs/wants. Clinician presented client with psycho-education on willingness. Clinician reviewed with client CBT triangle, with a goal to start noticing thoughts/feelings before smoking.  Plan: Return again in 1-2 weeks.  Diagnosis: Axis I: Alcohol Abuse, Substance Induced Mood Disorder and Cannabis use disorder       Harlon Ditty, LCSW 07/16/2020

## 2020-07-24 ENCOUNTER — Ambulatory Visit (INDEPENDENT_AMBULATORY_CARE_PROVIDER_SITE_OTHER): Payer: BC Managed Care – PPO | Admitting: Licensed Clinical Social Worker

## 2020-07-24 ENCOUNTER — Other Ambulatory Visit: Payer: Self-pay

## 2020-07-24 DIAGNOSIS — F1220 Cannabis dependence, uncomplicated: Secondary | ICD-10-CM | POA: Diagnosis not present

## 2020-07-24 DIAGNOSIS — F1994 Other psychoactive substance use, unspecified with psychoactive substance-induced mood disorder: Secondary | ICD-10-CM

## 2020-07-24 DIAGNOSIS — F1029 Alcohol dependence with unspecified alcohol-induced disorder: Secondary | ICD-10-CM

## 2020-07-24 NOTE — Progress Notes (Signed)
   THERAPIST PROGRESS NOTE  Session Time: 10am-10:45am  Participation Level: Active  Behavioral Response: CasualAlertEuthymic  Type of Therapy: Individual Therapy  Treatment Goals addressed: Coping  Interventions: CBT and Supportive  Summary: Virginia Ballard is a 39 y.o. female who presents with alcohol abuse, cannabis abuse, and substance induced mood disorder. Client shows progress toward goals AEB decrease in alcohol use to 1 time in the past week and increase in identification of thoughts related to marijuana use. Client shared friends response to client getting personal needs met with less consideration for others wants than her own needs. Client processed changes in viewing productivity at work and feeling at peace with deserving the same grace she provides others. Client reports increased ability to pause and identify thoughts and feelings and consider alternative responses to engagements with others.  Suicidal/Homicidal: Nowithout intent/plan  Therapist Response: Clinician met with client, assessing for SI/HI/psychosis and overall level of functioning. Clinician inquired about substance use and addressed craving thoughts. Clinician followed up with client on creating schedule to include time for personal care. Clinician explored with client peoples responses to her setting boundaries with her time and energy at work and in her friendships. Clinician praised client use of development of action plans for goals and willingness to identify and reframe uncomfortable emotions and thoughts.  Plan: Return again in 2 weeks.  Diagnosis: Axis I: Substance Abuse and Substance Induced Mood Disorder        Olegario Messier, LCSW 07/24/2020

## 2020-07-25 DIAGNOSIS — Z0000 Encounter for general adult medical examination without abnormal findings: Secondary | ICD-10-CM | POA: Diagnosis not present

## 2020-07-25 DIAGNOSIS — F319 Bipolar disorder, unspecified: Secondary | ICD-10-CM | POA: Diagnosis not present

## 2020-07-25 DIAGNOSIS — Z833 Family history of diabetes mellitus: Secondary | ICD-10-CM | POA: Diagnosis not present

## 2020-07-25 DIAGNOSIS — F17210 Nicotine dependence, cigarettes, uncomplicated: Secondary | ICD-10-CM | POA: Diagnosis not present

## 2020-07-25 DIAGNOSIS — K589 Irritable bowel syndrome without diarrhea: Secondary | ICD-10-CM | POA: Diagnosis not present

## 2020-07-25 DIAGNOSIS — G629 Polyneuropathy, unspecified: Secondary | ICD-10-CM | POA: Diagnosis not present
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
## 2020-07-31 DIAGNOSIS — Z1231 Encounter for screening mammogram for malignant neoplasm of breast: Secondary | ICD-10-CM | POA: Diagnosis not present

## 2020-08-06 DIAGNOSIS — Z01419 Encounter for gynecological examination (general) (routine) without abnormal findings: Secondary | ICD-10-CM | POA: Diagnosis not present

## 2020-08-07 ENCOUNTER — Ambulatory Visit (INDEPENDENT_AMBULATORY_CARE_PROVIDER_SITE_OTHER): Payer: BC Managed Care – PPO | Admitting: Licensed Clinical Social Worker

## 2020-08-07 ENCOUNTER — Other Ambulatory Visit: Payer: Self-pay

## 2020-08-07 DIAGNOSIS — F1994 Other psychoactive substance use, unspecified with psychoactive substance-induced mood disorder: Secondary | ICD-10-CM

## 2020-08-07 DIAGNOSIS — F1010 Alcohol abuse, uncomplicated: Secondary | ICD-10-CM | POA: Diagnosis not present

## 2020-08-07 DIAGNOSIS — F1210 Cannabis abuse, uncomplicated: Secondary | ICD-10-CM | POA: Diagnosis not present

## 2020-08-07 NOTE — Progress Notes (Signed)
Virtual Visit via Telephone Note  I connected with Virginia Ballard on 08/07/20 at 11:00 AM EST by telephone and verified that I am speaking with the correct person using two identifiers.  Location: Patient: home Provider: office   I discussed the limitations, risks, security and privacy concerns of performing an evaluation and management service by telephone and the availability of in person appointments. I also discussed with the patient that there may be a patient responsible charge related to this service. The patient expressed understanding and agreed to proceed.  I discussed the assessment and treatment plan with the patient. The patient was provided an opportunity to ask questions and all were answered. The patient agreed with the plan and demonstrated an understanding of the instructions.   The patient was advised to call back or seek an in-person evaluation if the symptoms worsen or if the condition fails to improve as anticipated.  I provided 30 minutes of non-face-to-face time during this encounter.   Harlon Ditty, LCSW    THERAPIST PROGRESS NOTE  Session Time: 11am-11:33am  Participation Level: Active  Behavioral Response: NAAlertEuthymic  Type of Therapy: Individual Therapy  Treatment Goals addressed: Coping and Diagnosis: polysubstance use  Interventions: Motivational Interviewing and Supportive  Summary: Virginia Ballard is a 39 y.o. female who presents with polysubstance use, unspecified anxiety, unspecified depression R/O substance induced mood disorder. Client reported linking with PCP for physical check up and re-starting medications. Client shared thoughts on long time friend's continued lack of response to client boundaries. Client shows progress toward goals AEB decreased amount and frequency of alcohol use however continues to use marijuana both recreationally and to address mood symptoms. Client reports she has been able to pause during a craving and  identify thought/feeling before smoking.   Suicidal/Homicidal: Nowithout intent/plan  Therapist Response: Clinician checked in with client, assessing for SI/HI/psychosis and overall level of functioning including substance use. Clinician utilize OARS with motivational interviewing to explore motivation for substance use in moderation vs total abstainance. Clinician inquired about relationship dynamics currently with daughter and holiday plans. Clinician discussed with client the importance of maintaining regular eating/sleeping schedule to maintain health which helps support improved mental health.  Plan: Return again in 2-3 weeks.  Diagnosis: Axis I: Alcohol Abuse and Substance Induced Mood Disorder       Harlon Ditty, LCSW 08/07/2020

## 2020-09-26 ENCOUNTER — Telehealth (INDEPENDENT_AMBULATORY_CARE_PROVIDER_SITE_OTHER): Payer: BC Managed Care – PPO | Admitting: Family Medicine

## 2020-09-26 ENCOUNTER — Other Ambulatory Visit: Payer: Self-pay

## 2020-09-26 DIAGNOSIS — B9689 Other specified bacterial agents as the cause of diseases classified elsewhere: Secondary | ICD-10-CM

## 2020-09-26 DIAGNOSIS — N760 Acute vaginitis: Secondary | ICD-10-CM | POA: Diagnosis not present

## 2020-09-26 MED ORDER — METRONIDAZOLE 0.75 % VA GEL: 1.0000 | Freq: Every day | VAGINAL | 0 refills | Status: AC

## 2020-09-26 NOTE — Progress Notes (Signed)
[facility] Telemedicine Visit  Patient consented to have virtual visit and was identified by name and date of birth. Method of visit: Telephone  Encounter participants: Patient: Virginia Ballard - located at home Provider: Shirlean Mylar - located at [facility]  Chief Complaint: vaginal odor  HPI:  Patient had a COVID-19 test this morning so in-person appointment transitioned to telephone visit. She reports no respiratory distress, able to tolerate PO, no fever. She reports a fishy vaginal odor for about a week. She is sexually active, last intercourse 2 weeks ago. She no longer has periods with IUD in place. She has had BV before in August and her symptoms are very much like that episode.  ROS: per HPI  Pertinent PMHx: BV  Exam:  There were no vitals taken for this visit.  Respiratory: able to speak in full sentences  Assessment/Plan:  BV (bacterial vaginosis) Patient with symptoms of BV, tested this morning for COVID-19, unable to perform in person appointment. Recommend metrogel x7d as patient has h/o of EtOH SUD. RTC given to patient, if not improved with medication, she will return for an appointment when her COVID-19 test is negative or she's out of the infectious window.     Time spent during visit with patient: 5 minutes

## 2020-09-26 NOTE — Assessment & Plan Note (Signed)
Patient with symptoms of BV, tested this morning for COVID-19, unable to perform in person appointment. Recommend metrogel x7d as patient has h/o of EtOH SUD. RTC given to patient, if not improved with medication, she will return for an appointment when her COVID-19 test is negative or she's out of the infectious window.

## 2021-04-15 ENCOUNTER — Ambulatory Visit (INDEPENDENT_AMBULATORY_CARE_PROVIDER_SITE_OTHER): Payer: BC Managed Care – PPO | Admitting: Family Medicine

## 2021-04-15 ENCOUNTER — Other Ambulatory Visit: Payer: Self-pay

## 2021-04-15 ENCOUNTER — Encounter: Payer: Self-pay | Admitting: Family Medicine

## 2021-04-15 DIAGNOSIS — F319 Bipolar disorder, unspecified: Secondary | ICD-10-CM | POA: Diagnosis not present
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
## 2021-04-15 NOTE — Patient Instructions (Signed)
It was so nice to see you today.  I am sorry you are having all of these difficulties.  Regarding your Mirena it was recently FDA approved for 7 years so there is no need to change it right now.  Regarding your depression please follow-up with your primary care provider soon as possible to discuss your medications.  Please also bring up the financial strain you are under and they may be able to provide some assistance or help finding resources.  If you have any thoughts of hurting yourself or anyone else please reach out to the resources listed below.  I hope you have a wonderful afternoon!   If you are feeling suicidal or depression symptoms worsen please immediately go to:   If you are thinking about harming yourself or having thoughts of suicide, or if you know someone who is, seek help right away. If you are in crisis, make sure you are not left alone.  If someone else is in crisis, make sure he/she/they is not left alone  Call 988 OR 1-800-273-TALK  24 Hour Availability for Walk-IN services  [facility]  [address] [place], [place] [phone] Crisis [phone]    Other crisis resources:  [facility] [employer] (Domestic Violence, Rape & Victim Assistance [phone]  [facility]    (ONLY from 8am-4pm)    [phone]  [facility] (24/7)   [phone]  [place] National Suicide Hotline   [phone] Len Childs)

## 2021-04-16 NOTE — Assessment & Plan Note (Signed)
Patient currently following with a different primary care provider who is managing her psychiatric illnesses.  Reports they just darted her on Seroquel but she cannot take it daily.  Tearful with passive SI today.  PHQ-9 was 27 out of 27.  Reports that she is no longer seeing psychiatry.  Discussed her thoughts of SI and she reports she wishes she were dead daily but she does not have a plan.  She reports that she was a cutter in the past.  Says that she would never actually hurt herself because of her daughter who is her support person.  Provided patient with contact information for emergency psychiatric resources and attempted multiple times to call her new primary care provider to schedule follow-up visit.  She reports that she will do this on her own.  No further questions or concerns.

## 2021-12-08 DIAGNOSIS — F3181 Bipolar II disorder: Secondary | ICD-10-CM | POA: Diagnosis not present

## 2021-12-08 DIAGNOSIS — R5383 Other fatigue: Secondary | ICD-10-CM | POA: Diagnosis not present

## 2021-12-08 DIAGNOSIS — M79605 Pain in left leg: Secondary | ICD-10-CM | POA: Diagnosis not present

## 2021-12-08 DIAGNOSIS — E559 Vitamin D deficiency, unspecified: Secondary | ICD-10-CM | POA: Diagnosis not present

## 2021-12-08 DIAGNOSIS — R634 Abnormal weight loss: Secondary | ICD-10-CM | POA: Diagnosis not present

## 2021-12-08 DIAGNOSIS — Z136 Encounter for screening for cardiovascular disorders: Secondary | ICD-10-CM | POA: Diagnosis not present

## 2021-12-08 DIAGNOSIS — F331 Major depressive disorder, recurrent, moderate: Secondary | ICD-10-CM | POA: Diagnosis not present

## 2021-12-08 DIAGNOSIS — F418 Other specified anxiety disorders: Secondary | ICD-10-CM | POA: Diagnosis not present

## 2022-01-29 DIAGNOSIS — Z0001 Encounter for general adult medical examination with abnormal findings: Secondary | ICD-10-CM | POA: Diagnosis not present

## 2022-01-29 DIAGNOSIS — Z113 Encounter for screening for infections with a predominantly sexual mode of transmission: Secondary | ICD-10-CM | POA: Diagnosis not present

## 2022-01-29 DIAGNOSIS — F3181 Bipolar II disorder: Secondary | ICD-10-CM | POA: Diagnosis not present

## 2022-01-29 DIAGNOSIS — L739 Follicular disorder, unspecified: Secondary | ICD-10-CM | POA: Diagnosis not present

## 2022-01-29 DIAGNOSIS — Z124 Encounter for screening for malignant neoplasm of cervix: Secondary | ICD-10-CM | POA: Diagnosis not present

## 2022-01-29 DIAGNOSIS — Z01419 Encounter for gynecological examination (general) (routine) without abnormal findings: Secondary | ICD-10-CM | POA: Diagnosis not present

## 2022-01-29 DIAGNOSIS — R300 Dysuria: Secondary | ICD-10-CM | POA: Diagnosis not present

## 2022-01-29 DIAGNOSIS — N898 Other specified noninflammatory disorders of vagina: Secondary | ICD-10-CM | POA: Diagnosis not present

## 2022-02-03 ENCOUNTER — Telehealth: Payer: Self-pay | Admitting: Student

## 2022-02-03 NOTE — Telephone Encounter (Signed)
Patient calls regarding a cyst she has on her lip, like the one that came up by her ear in 2020. Patient states when she saw Dr. Gwendlyn Deutscher for the one on her ear, she treated it and it healed great. Patient would like for Dr. Gwendlyn Deutscher to look at the one on her lip now if possible. Advised patient she most likely will need an office visit first and then will be referred to our derm clinic again, but patient didn't want to schedule anything yet. ? ?Routing to Dr. Macario Golds team to be advised. ?

## 2022-02-07 DIAGNOSIS — F17210 Nicotine dependence, cigarettes, uncomplicated: Secondary | ICD-10-CM | POA: Diagnosis not present

## 2022-02-07 DIAGNOSIS — Z20822 Contact with and (suspected) exposure to covid-19: Secondary | ICD-10-CM | POA: Diagnosis not present

## 2022-02-07 DIAGNOSIS — R45851 Suicidal ideations: Secondary | ICD-10-CM | POA: Diagnosis not present

## 2022-02-07 DIAGNOSIS — F3189 Other bipolar disorder: Secondary | ICD-10-CM | POA: Diagnosis not present

## 2022-02-07 DIAGNOSIS — F319 Bipolar disorder, unspecified: Secondary | ICD-10-CM | POA: Diagnosis not present

## 2022-02-08 DIAGNOSIS — F319 Bipolar disorder, unspecified: Secondary | ICD-10-CM | POA: Diagnosis not present

## 2022-02-08 DIAGNOSIS — J45909 Unspecified asthma, uncomplicated: Secondary | ICD-10-CM | POA: Diagnosis not present

## 2022-02-08 DIAGNOSIS — R45850 Homicidal ideations: Secondary | ICD-10-CM | POA: Diagnosis not present

## 2022-02-08 DIAGNOSIS — Z62810 Personal history of physical and sexual abuse in childhood: Secondary | ICD-10-CM | POA: Diagnosis not present

## 2022-02-08 DIAGNOSIS — I10 Essential (primary) hypertension: Secondary | ICD-10-CM | POA: Diagnosis not present

## 2022-02-08 DIAGNOSIS — F1020 Alcohol dependence, uncomplicated: Secondary | ICD-10-CM | POA: Diagnosis not present

## 2022-02-08 DIAGNOSIS — Z6827 Body mass index (BMI) 27.0-27.9, adult: Secondary | ICD-10-CM | POA: Diagnosis not present

## 2022-02-08 DIAGNOSIS — H6123 Impacted cerumen, bilateral: Secondary | ICD-10-CM | POA: Diagnosis not present

## 2022-02-08 DIAGNOSIS — F4310 Post-traumatic stress disorder, unspecified: Secondary | ICD-10-CM | POA: Diagnosis not present

## 2022-02-08 DIAGNOSIS — E663 Overweight: Secondary | ICD-10-CM | POA: Diagnosis not present

## 2022-02-08 DIAGNOSIS — Z9152 Personal history of nonsuicidal self-harm: Secondary | ICD-10-CM | POA: Diagnosis not present

## 2022-02-08 DIAGNOSIS — Z9151 Personal history of suicidal behavior: Secondary | ICD-10-CM | POA: Diagnosis not present

## 2022-02-08 DIAGNOSIS — R45851 Suicidal ideations: Secondary | ICD-10-CM | POA: Diagnosis not present

## 2022-02-08 DIAGNOSIS — N764 Abscess of vulva: Secondary | ICD-10-CM | POA: Diagnosis not present

## 2022-02-08 DIAGNOSIS — E282 Polycystic ovarian syndrome: Secondary | ICD-10-CM | POA: Diagnosis not present

## 2022-02-08 DIAGNOSIS — F1210 Cannabis abuse, uncomplicated: Secondary | ICD-10-CM | POA: Diagnosis not present

## 2022-02-23 ENCOUNTER — Encounter: Payer: Self-pay | Admitting: *Deleted

## 2022-04-22 ENCOUNTER — Ambulatory Visit: Payer: BC Managed Care – PPO | Admitting: Student

## 2022-08-19 DIAGNOSIS — Z0000 Encounter for general adult medical examination without abnormal findings: Secondary | ICD-10-CM | POA: Diagnosis not present

## 2022-08-19 DIAGNOSIS — Z113 Encounter for screening for infections with a predominantly sexual mode of transmission: Secondary | ICD-10-CM | POA: Diagnosis not present

## 2022-08-19 DIAGNOSIS — Z01419 Encounter for gynecological examination (general) (routine) without abnormal findings: Secondary | ICD-10-CM | POA: Diagnosis not present

## 2022-08-19 DIAGNOSIS — N907 Vulvar cyst: Secondary | ICD-10-CM | POA: Diagnosis not present

## 2022-08-19 DIAGNOSIS — Z30431 Encounter for routine checking of intrauterine contraceptive device: Secondary | ICD-10-CM | POA: Diagnosis not present

## 2022-08-26 ENCOUNTER — Ambulatory Visit: Payer: BC Managed Care – PPO

## 2022-08-31 ENCOUNTER — Encounter: Payer: BC Managed Care – PPO | Admitting: Student

## 2023-02-22 ENCOUNTER — Ambulatory Visit
Admission: EM | Admit: 2023-02-22 | Discharge: 2023-02-22 | Disposition: A | Discharge status: Home / Self Care | Payer: BC Managed Care – PPO | Attending: Emergency Medicine | Admitting: Emergency Medicine

## 2023-02-22 ENCOUNTER — Encounter: Payer: Self-pay | Admitting: *Deleted

## 2023-02-22 DIAGNOSIS — R45851 Suicidal ideations: Secondary | ICD-10-CM

## 2023-02-22 DIAGNOSIS — L0291 Cutaneous abscess, unspecified: Secondary | ICD-10-CM

## 2023-02-22 MED ORDER — POVIDONE-IODINE 10 % EX SOLN: CUTANEOUS | Status: DC | PRN

## 2023-02-22 MED ORDER — DOXYCYCLINE HYCLATE 100 MG PO CAPS: 100.0000 mg | ORAL_CAPSULE | Freq: Two times a day (BID) | ORAL | 0 refills | Status: DC

## 2023-02-22 MED ORDER — LIDOCAINE HCL (PF) 1 % IJ SOLN
10.0000 mL | Freq: Once | INTRAMUSCULAR | Status: AC
  Administered 2023-02-22: 10 mL via INTRADERMAL

## 2023-02-22 NOTE — ED Triage Notes (Signed)
Pt moved to room, seated with door open and in view of nurses at all times until police arrival.

## 2023-02-22 NOTE — ED Triage Notes (Signed)
Pt states she has an abscess under her left arm pit x 3 days. She has done heat at home without relief.

## 2023-02-22 NOTE — ED Provider Notes (Addendum)
[facility]    CSN: [id number] Arrival date & time: 02/22/23  1252      History   Chief Complaint Chief Complaint  Patient presents with   Abscess    HPI Virginia Ballard is a 42 y.o. female.   42 year old female pt, Virginia Ballard, presents to urgent care with complaint of boil under her left arm x 3 days, pt used heat and fatback without relief. Pt stated to CMA and this provider that she is suicidal, having active suicidal thoughts, "I'm a cutter I have access to everything, if you keep asking these questions I'm gonna get locked up", pt immediately became irate, pt states she does not have counselor/therapist, charge nurse Chanda notified, pt moved to room for 1:1.  The history is provided by the patient. No language interpreter was used.    Past Medical History:  Diagnosis Date   Bipolar disorder (HCC)    diagnosed >2 yrs ago   Depression    diagnosed >2 yrs ago   Medical history non-contributory     Patient Active Problem List   Diagnosis Date Noted   Suicidal ideations 02/22/2023   Abscess 02/22/2023   BV (bacterial vaginosis) 09/26/2020   Adjustment disorder with mixed anxiety and depressed mood 07/01/2020   Hidradenitis suppurativa 04/21/2020   Screen for STD (sexually transmitted disease) 01/10/2020   Shortness of breath 03/19/2019   Abdominal pain 03/18/2019   Bilateral knee pain 03/18/2019   Perichondritis of ear, left 08/29/2018   Dark urine 08/25/2018   Pre-diabetes 08/25/2018   IUD check up 03/25/2016   Encounter for IUD removal and reinsertion 02/24/2016   Discharge of vagina 12/17/2015   Trichomonas contact, treated 12/17/2015   Attention deficit hyperactivity disorder (ADHD) 11/04/2015    Class: Chronic   Depression, major, recurrent, moderate (HCC) 10/28/2015    Class: Chronic   POLYCYSTIC OVARY 11/17/2006   OBESITY, NOS 11/17/2006   Bipolar I disorder (HCC) 11/17/2006   TOBACCO DEPENDENCE 11/17/2006   ASTHMA, UNSPECIFIED  11/17/2006   HIRSUTISM 11/17/2006    Past Surgical History:  Procedure Laterality Date   NO PAST SURGERIES      OB History   No obstetric history on file.      Home Medications    Prior to Admission medications   Medication Sig Start Date End Date Taking? Authorizing Provider  doxycycline (VIBRAMYCIN) 100 MG capsule Take 1 capsule (100 mg total) by mouth 2 (two) times daily. 02/22/23  Yes Noelia Lenart, Para March, NP  FLUoxetine (PROZAC) 40 MG capsule Take 40 mg by mouth every morning.   Yes [provider]  acetaminophen (TYLENOL) 500 MG tablet Take 2,500 mg by mouth every 8 (eight) hours as needed for moderate pain.    [provider]  ARIPiprazole (ABILIFY) 5 MG tablet Take 1 tablet (5 mg total) by mouth once for 1 dose. 07/01/20 07/01/20  Rankin, Shuvon B, NP  nicotine (NICODERM CQ - DOSED IN MG/24 HOURS) 21 mg/24hr patch Place 1 patch (21 mg total) onto the skin daily. Patient not taking: Reported on 11/02/2019 05/15/19   Moses Manners, MD  varenicline (CHANTIX) 1 MG tablet Take 1 tablet (1 mg total) by mouth 2 (two) times daily with a meal. Patient not taking: Reported on 07/01/2020 11/02/19   Moses Manners, MD    Family History Family History  Problem Relation Age of Onset   Alcohol abuse Father     Social History Social History   Tobacco Use  Smoking status: Every Day    Packs/day: 0.75    Years: 22.00    Additional pack years: 0.00    Total pack years: 16.50    Types: Cigarettes    Start date: 09/21/1995   Smokeless tobacco: Never   Tobacco comments:    Quit for about 1 year during pregnancy  Substance Use Topics   Alcohol use: No    Comment: States only occasional use   Drug use: Yes    Types: Marijuana    Comment: one blunt daily (THC)     Allergies   Patient has no known allergies.   Review of Systems Review of Systems  Constitutional:  Negative for fever.  Skin:  Positive for color change and wound.  Psychiatric/Behavioral:   Positive for suicidal ideas.   All other systems reviewed and are negative.    Physical Exam Triage Vital Signs ED Triage Vitals  Enc Vitals Group     BP 02/22/23 1336 135/84     Pulse Rate 02/22/23 1336 83     Resp 02/22/23 1336 18     Temp 02/22/23 1336 98.7 F (37.1 C)     Temp Source 02/22/23 1336 Oral     SpO2 02/22/23 1336 98 %     Weight --      Height --      Head Circumference --      Peak Flow --      Pain Score 02/22/23 1334 10     Pain Loc --      Pain Edu? --      Excl. in GC? --    No data found.  Updated Vital Signs BP 135/84 (BP Location: Right Arm)   Pulse 83   Temp 98.7 F (37.1 C) (Oral)   Resp 18   SpO2 98%   Visual Acuity Right Eye Distance:   Left Eye Distance:   Bilateral Distance:    Right Eye Near:   Left Eye Near:    Bilateral Near:     Physical Exam Vitals and nursing note reviewed.  Constitutional:      Appearance: She is well-developed and well-groomed.  HENT:     Head: Normocephalic.  Skin:    Findings: Abscess present.     Comments: Left axillae abscess noted~2x2 cm  Neurological:     General: No focal deficit present.     Mental Status: She is alert and oriented to person, place, and time.     GCS: GCS eye subscore is 4. GCS verbal subscore is 5. GCS motor subscore is 6.  Psychiatric:        Attention and Perception: Attention normal.        Mood and Affect: Affect is labile.        Speech: Speech normal.        Behavior: Behavior is agitated.        Thought Content: Thought content includes suicidal ideation. Thought content includes suicidal plan.      UC Treatments / Results  Labs (all labs ordered are listed, but only abnormal results are displayed) Labs Reviewed  AEROBIC/ANAEROBIC CULTURE W GRAM STAIN (SURGICAL/DEEP WOUND)    EKG   Radiology No results found.  Procedures Incision and Drainage  Date/Time: 02/22/2023 3:00 PM  Performed by: Clancy Gourd, NP Authorized by: Clancy Gourd, NP    Consent:    Consent obtained:  Verbal   Consent given by:  Patient   Risks, benefits, and alternatives were discussed: yes  Risks discussed:  Bleeding, incomplete drainage, pain and infection Universal protocol:    Procedure explained and questions answered to patient or proxy's satisfaction: yes     Immediately prior to procedure, a time out was called: yes     Patient identity confirmed:  Verbally with patient, arm band, provided demographic data and hospital-assigned identification number Location:    Type:  Abscess   Size:  2x2 cm   Location: Left axilla. Pre-procedure details:    Skin preparation:  Povidone-iodine Sedation:    Sedation type:  None Anesthesia:    Anesthesia method:  Local infiltration   Local anesthetic:  Lidocaine 1% w/o epi Procedure type:    Complexity:  Simple Procedure details:    Incision types:  Single straight   Incision depth:  Dermal   Wound management:  Probed and deloculated   Drainage:  Purulent   Drainage amount:  Copious   Wound treatment:  Drain placed   Packing materials:  1/4 in iodoform gauze Post-procedure details:    Procedure completion:  Tolerated well, no immediate complications  (including critical care time)  Medications Ordered in UC Medications  lidocaine (PF) (XYLOCAINE) 1 % injection 10 mL (10 mLs Intradermal Given 02/22/23 1444)    Initial Impression / Assessment and Plan / UC Course  I have reviewed the triage vital signs and the nursing notes.  Pertinent labs & imaging results that were available during my care of the patient were reviewed by me and considered in my medical decision making (see chart for details).  1425: D Wiggins with local law enforcement here, states "not a doctor, but we got her to laugh, she's just going through a lot, is homeless and doesn't meet criteria for law enforcement to intervene". Doyne Keel, charge nurse aware.  1428: Spoke with Dr. Leonides Grills, UC medical director, ok to discharge pt,  recommend and refer to ER if law enforcement will not intervene/EMS will not either. Discussed plan of care with pt, CMA Hulan Saas present, encouraged pt to seek mental health evaluation at local ER. Reviewed abscess care/dressing/antibiotic follow up with PCP. Suicide and crisis line 988 and 911 numbers given to pt. Pt verbalized understanding to this provider.       Ddx: Suicidal ideations, bipolar,abscess Final Clinical Impressions(s) / UC Diagnoses   Final diagnoses:  Suicidal ideations  Abscess     Discharge Instructions      Remove packing after 3 days, take antibiotic as directed. Take home meds as directed. Follow up with PCP. Return as needed.   Please follow up with a mental health provider regarding your thoughts/feelings-go to ER for mental health evaluation/assistance.   988(suicide and crisis line) or 911 for assistance     ED Prescriptions     Medication Sig Dispense Auth. Provider   doxycycline (VIBRAMYCIN) 100 MG capsule Take 1 capsule (100 mg total) by mouth 2 (two) times daily. 20 capsule Suzanne Kho, Para March, NP      PDMP not reviewed this encounter.   Clancy Gourd, NP 02/22/23 2157    Clancy Gourd, NP 02/22/23 2159

## 2023-02-22 NOTE — Discharge Instructions (Addendum)
Remove packing after 3 days, take antibiotic as directed. Take home meds as directed. Follow up with PCP. Return as needed.   Please follow up with a mental health provider regarding your thoughts/feelings-go to ER for mental health evaluation/assistance.   988(suicide and crisis line) or 911 for assistance

## 2023-02-22 NOTE — ED Triage Notes (Signed)
911 notified of +SI with plan. Police to bedside.

## 2023-02-23 LAB — AEROBIC/ANAEROBIC CULTURE W GRAM STAIN (SURGICAL/DEEP WOUND): Culture: NO GROWTH

## 2023-02-24 LAB — AEROBIC/ANAEROBIC CULTURE W GRAM STAIN (SURGICAL/DEEP WOUND)

## 2023-02-25 LAB — AEROBIC/ANAEROBIC CULTURE W GRAM STAIN (SURGICAL/DEEP WOUND)

## 2023-02-28 ENCOUNTER — Telehealth (HOSPITAL_COMMUNITY): Payer: Self-pay | Admitting: Emergency Medicine

## 2023-02-28 LAB — AEROBIC/ANAEROBIC CULTURE W GRAM STAIN (SURGICAL/DEEP WOUND)

## 2023-02-28 MED ORDER — AMOXICILLIN-POT CLAVULANATE 875-125 MG PO TABS: 1.0000 | ORAL_TABLET | Freq: Two times a day (BID) | ORAL | 0 refills | Status: AC

## 2023-04-19 ENCOUNTER — Other Ambulatory Visit (HOSPITAL_COMMUNITY)
Admission: RE | Admit: 2023-04-19 | Discharge: 2023-04-19 | Disposition: A | Discharge status: Home / Self Care | Payer: BC Managed Care – PPO | Source: Ambulatory Visit | Attending: Family Medicine | Admitting: Family Medicine

## 2023-04-19 ENCOUNTER — Ambulatory Visit (INDEPENDENT_AMBULATORY_CARE_PROVIDER_SITE_OTHER): Payer: BC Managed Care – PPO | Admitting: Student

## 2023-04-19 ENCOUNTER — Encounter: Payer: Self-pay | Admitting: Student

## 2023-04-19 VITALS — BP 126/80 | HR 91 | Ht 66.0 in | Wt 207.2 lb

## 2023-04-19 DIAGNOSIS — F17200 Nicotine dependence, unspecified, uncomplicated: Secondary | ICD-10-CM

## 2023-04-19 DIAGNOSIS — F319 Bipolar disorder, unspecified: Secondary | ICD-10-CM

## 2023-04-19 DIAGNOSIS — Z59819 Housing instability, housed unspecified: Secondary | ICD-10-CM | POA: Diagnosis not present

## 2023-04-19 DIAGNOSIS — Z113 Encounter for screening for infections with a predominantly sexual mode of transmission: Secondary | ICD-10-CM

## 2023-04-19 DIAGNOSIS — Z0000 Encounter for general adult medical examination without abnormal findings: Secondary | ICD-10-CM | POA: Diagnosis not present

## 2023-04-19 NOTE — Assessment & Plan Note (Signed)
Sexually active female inconsistent with barrier protection.  Order labs for HIV, RPR, and axillary urine cytology for GC chlamydia. Due for hep C screening, order labs for hep C.

## 2023-04-19 NOTE — Assessment & Plan Note (Signed)
Declined completion of PHQ-9 questionnaire or any questionnaire.  He is feeling generally stressed with life attributed to financial insecurity.  Noncompliant with her medications and have some underlying mistrust of the medical system which could be attributing to difficulty in her overall management.  Notices daily use of marijuana which she says is the only intervention that has helped her mood disorder. -Decline PHQ 9  -Declined medication refills or counseling -Encourage patient to follow-up with her psychiatrist who is managing her mental health.

## 2023-04-19 NOTE — Progress Notes (Signed)
Annual Wellness Visit     Patient: Virginia Ballard, Female    DOB: 17-Sep-1981, 43 y.o.   MRN: [id number]  Subjective  Chief Complaint  Patient presents with   Annual Exam    Virginia Ballard is a 42 y.o. female who presents today for her Annual Wellness Visit.  Diet: Regular, junk food and processed food  Sleep: 5hours, unable to stay asleep because she is stress and anxious  Exercise: No exercise, but active  Alcohol use:  4oz every 3-4 days  Tobacco use: 1/2 pack a day for 20years Illicit drug use: Marijuana daily  Sexually active: Sexual active with one female, no protection. on IUD Works as: Photographer  Lives with: Shed behind a house, housing insecurity Code Status: Full code  Power of Attorney: None at the moment   Medical concerns: Housing concerns .  Medications: Outpatient Medications Prior to Visit  Medication Sig   acetaminophen (TYLENOL) 500 MG tablet Take 2,500 mg by mouth every 8 (eight) hours as needed for moderate pain.   ARIPiprazole (ABILIFY) 5 MG tablet Take 1 tablet (5 mg total) by mouth once for 1 dose.   doxycycline (VIBRAMYCIN) 100 MG capsule Take 1 capsule (100 mg total) by mouth 2 (two) times daily.   FLUoxetine (PROZAC) 40 MG capsule Take 40 mg by mouth every morning.   nicotine (NICODERM CQ - DOSED IN MG/24 HOURS) 21 mg/24hr patch Place 1 patch (21 mg total) onto the skin daily. (Patient not taking: Reported on 11/02/2019)   varenicline (CHANTIX) 1 MG tablet Take 1 tablet (1 mg total) by mouth 2 (two) times daily with a meal. (Patient not taking: Reported on 07/01/2020)   No facility-administered medications prior to visit.    No Known Allergies  Patient Care Team: Virginia Simon, MD as PCP - General (Family Medicine)   Objective  BP 126/80   Pulse 91   Ht 5\' 6"  (1.676 m)   Wt 207 lb 4 oz (94 kg)   SpO2 100%   BMI 33.45 kg/m      04/15/2021    2:34 PM 04/21/2020   11:28 AM 01/07/2020    3:59 PM  PHQ9 SCORE ONLY  PHQ-9  Total Score 27 9 0   General: Alert, well appearing, NAD HEENT: Atraumatic, MMM, No sclera icterus CV: RRR, no murmurs, normal S1/S2 Pulm: CTAB, good WOB on RA, no crackles or wheezing Abd: Soft, no distension, no tenderness Skin: dry, warm Ext: No BLE edema, +2 Pedal and radial pulse.  Neuro: Cranial nerves II to XII intact, no focal neurological deficits.     Most recent depression screenings:    04/15/2021    2:34 PM 04/21/2020   11:28 AM  PHQ 2/9 Scores  PHQ - 2 Score 6 0  PHQ- 9 Score 27 9    Assessment & Plan   42 year old female presenting today for wellness visits.  No medical concerns today.  However during encounter patient appears to being stressed mostly by-in general financial and housing insecurities.  Annual wellness visit done today including the all of the following: Reviewed patient's Family Medical History Reviewed and updated list of patient's medical providers Assessment of cognitive impairment was done Assessed patient's functional ability Established a written schedule for health screening services Health Risk Assessent Completed and Reviewed  Exercise Activities and Dietary recommendations  Goals   None     Immunization History  Administered Date(s) Administered   Influenza,inj,Quad PF,6+ Mos 08/06/2016, 09/26/2018   Td  09/20/1997   Tdap 04/14/2011    Health Maintenance  Topic Date Due   Hepatitis C Screening  Never done   DTaP/Tdap/Td (3 - Td or Tdap) 04/13/2021   PAP SMEAR-Modifier  11/14/2021   COVID-19 Vaccine (1 - 2023-24 season) Never done   INFLUENZA VACCINE  04/21/2023   HIV Screening  Completed   HPV VACCINES  Aged Out     Discussed health benefits of physical activity, and encouraged her to engage in regular exercise appropriate for her age and condition.    Problem List Items Addressed This Visit       Other   Bipolar I disorder (HCC)    Declined completion of PHQ-9 questionnaire or any questionnaire.  He is feeling  generally stressed with life attributed to financial insecurity.  Noncompliant with her medications and have some underlying mistrust of the medical system which could be attributing to difficulty in her overall management.  Notices daily use of marijuana which she says is the only intervention that has helped her mood disorder. -Decline PHQ 9  -Declined medication refills or counseling -Encourage patient to follow-up with her psychiatrist who is managing her mental health.      TOBACCO DEPENDENCE    Smokes half a pack a day for 20 years.  Patient expressed no desire on cessation of quitting smoking. -Declined assistance for smoking cessation       Screen for STD (sexually transmitted disease)    Sexually active female inconsistent with barrier protection.  Order labs for HIV, RPR, and axillary urine cytology for GC chlamydia. Due for hep C screening, order labs for hep C.      Relevant Orders   Hepatitis C antibody (reflex, frozen specimen)   HIV antibody (with reflex)   Urine cytology ancillary only   RPR   Housing insecurity - Primary    Reports currently living initiated and having poor diet as she mostly eats food which she attributes to issues with finances.  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  Patient will benefit from financial assistance. -Placed order for [phone] with social work      Relevant Orders   AMB Referral to Managed Medicaid Care Management   Other Visit Diagnoses     Routine general medical examination at a health care facility       Relevant Orders   Basic Metabolic Panel   CBC       Return in 1 year (on 04/18/2024).     Virginia Simon, MD

## 2023-04-19 NOTE — Assessment & Plan Note (Addendum)
Smokes half a pack a day for 20 years.  Patient expressed no desire on cessation of quitting smoking. -Declined assistance for smoking cessation

## 2023-04-19 NOTE — Assessment & Plan Note (Signed)
Reports currently living initiated and having poor diet as she mostly eats food which she attributes to issues with finances.  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  Patient will benefit from financial assistance. -Placed order for [phone] with social work

## 2023-04-21 ENCOUNTER — Other Ambulatory Visit: Payer: Self-pay

## 2023-04-21 ENCOUNTER — Other Ambulatory Visit: Payer: Self-pay | Admitting: Student

## 2023-04-21 ENCOUNTER — Ambulatory Visit (INDEPENDENT_AMBULATORY_CARE_PROVIDER_SITE_OTHER): Payer: BC Managed Care – PPO | Admitting: Family Medicine

## 2023-04-21 ENCOUNTER — Encounter: Payer: Self-pay | Admitting: Family Medicine

## 2023-04-21 VITALS — BP 126/85 | HR 88 | Ht 66.0 in | Wt 203.2 lb

## 2023-04-21 DIAGNOSIS — Z30433 Encounter for removal and reinsertion of intrauterine contraceptive device: Secondary | ICD-10-CM

## 2023-04-21 NOTE — Patient Instructions (Signed)
I have placed a referral to OBGYN to get your IUD removed and a new one inserted.  In the meantime, you are welcome to call local OB/GYN offices to try and get an appointment earlier.  In the meantime I do recommend an alternative form of contraception.  You are welcome to call our office if you have any questions about this.

## 2023-04-21 NOTE — Progress Notes (Signed)
  SUBJECTIVE:   CHIEF COMPLAINT / HPI:   Pt here for IUD removal and insertion. Mirena placed in 2017.  Checklist: Multiple Partners   [_] Yes     [x]  No Dysmenorrhea   [_] Yes     [x]  No PID/STD    [_] Yes     [x]  No Ectopic Pregnancy   [_] Yes     [x]  No Breastfeeding    [_] Yes     [x]  No  PERTINENT  PMH / PSH:   Past Medical History:  Diagnosis Date   Bipolar disorder (HCC)    diagnosed >2 yrs ago   Depression    diagnosed >2 yrs ago   Medical history non-contributory     OBJECTIVE:  BP 126/85   Pulse 88   Ht 5\' 6"  (1.676 m)   Wt 203 lb 3.2 oz (92.2 kg)   SpO2 100%   BMI 32.80 kg/m   General: NAD, pleasant, able to participate in exam Respiratory: No respiratory distress Skin: warm and dry, no rashes noted  Pelvic: Vaginal mucosa without significant erythema or irritation.  Cervical os visualized without any significant discharge.  However, IUD strings were unable to be visualized.  Limited bedside ultrasound completed with Dr. Linwood Dibbles and confirmed presence of IUD within the uterus.  ASSESSMENT/PLAN:   1. Encounter for IUD removal and reinsertion Patient given informed consent, signed copy in the chart.Marland Kitchen   Appropriate time out taken.   Sterile instruments and technique was used. Cervix brought into view with use of speculum.  However, the strings of her current IUD were NOT visualized. Ring forceps as well as a small swab were used to try and visualize the strings but this was unsuccessful.   Due to inability to visualize IUD strings and remove her current IUD, the procedure was aborted. Patient tolerated the procedure well.  A limited bedside ultrasound was completed with Dr. Linwood Dibbles and confirmed presence of IUD within the uterus.   Discussed next steps with patient - placed referral to OBGYN for IUD removal and insertion. The patient was understandably frustrated but agreed with this plan. In the meantime I recommended using an alternate form of contraception  until she is able to get in with OBGYN. She stated she was not interested in discussing alternative options at this time but would let us know if she had questions.   - Ambulatory referral to Obstetrics / Gynecology  No orders of the defined types were placed in this encounter.  Return As needed.  Vonna Drafts, MD [facility]

## 2023-04-21 NOTE — Addendum Note (Signed)
Addended by: Burley Saver E on: 04/21/2023 10:17 AM   Modules accepted: Level of Service

## 2023-04-24 MED ORDER — FLUOXETINE HCL 40 MG PO CAPS
40.0000 mg | ORAL_CAPSULE | Freq: Every morning | ORAL | 2 refills | Status: DC
  Filled 2023-04-24 – 2023-04-28 (×3): qty 30, 30d supply, fill #0

## 2023-04-25 ENCOUNTER — Other Ambulatory Visit: Payer: Self-pay

## 2023-04-28 ENCOUNTER — Other Ambulatory Visit: Payer: Self-pay

## 2023-04-28 ENCOUNTER — Other Ambulatory Visit (HOSPITAL_COMMUNITY): Payer: Self-pay

## 2023-06-02 DIAGNOSIS — Z30433 Encounter for removal and reinsertion of intrauterine contraceptive device: Secondary | ICD-10-CM | POA: Diagnosis not present

## 2023-06-30 DIAGNOSIS — N907 Vulvar cyst: Secondary | ICD-10-CM | POA: Diagnosis not present

## 2023-07-01 DIAGNOSIS — N907 Vulvar cyst: Secondary | ICD-10-CM | POA: Diagnosis not present

## 2023-08-22 DIAGNOSIS — Z113 Encounter for screening for infections with a predominantly sexual mode of transmission: Secondary | ICD-10-CM | POA: Diagnosis not present

## 2023-08-22 DIAGNOSIS — Z01419 Encounter for gynecological examination (general) (routine) without abnormal findings: Secondary | ICD-10-CM | POA: Diagnosis not present

## 2023-08-22 DIAGNOSIS — E6689 Other obesity not elsewhere classified: Secondary | ICD-10-CM | POA: Diagnosis not present

## 2023-08-22 DIAGNOSIS — Z0000 Encounter for general adult medical examination without abnormal findings: Secondary | ICD-10-CM | POA: Diagnosis not present

## 2023-12-02 DIAGNOSIS — R7303 Prediabetes: Secondary | ICD-10-CM | POA: Diagnosis not present

## 2024-02-20 ENCOUNTER — Other Ambulatory Visit (HOSPITAL_COMMUNITY)
Admission: RE | Admit: 2024-02-20 | Discharge: 2024-02-20 | Disposition: A | Discharge status: Home / Self Care | Payer: Self-pay | Source: Ambulatory Visit | Attending: Family Medicine | Admitting: Family Medicine

## 2024-02-20 ENCOUNTER — Encounter: Payer: Self-pay | Admitting: Student

## 2024-02-20 ENCOUNTER — Ambulatory Visit (INDEPENDENT_AMBULATORY_CARE_PROVIDER_SITE_OTHER): Admitting: Student

## 2024-02-20 VITALS — BP 138/81 | HR 88 | Ht 66.0 in | Wt 200.0 lb

## 2024-02-20 DIAGNOSIS — Z113 Encounter for screening for infections with a predominantly sexual mode of transmission: Secondary | ICD-10-CM | POA: Insufficient documentation

## 2024-02-20 DIAGNOSIS — Z124 Encounter for screening for malignant neoplasm of cervix: Secondary | ICD-10-CM | POA: Diagnosis not present

## 2024-02-20 NOTE — Patient Instructions (Signed)
 Pleasure to see you today.  Today we collected samples to test for gonorrhea, chlamydia and BV.  The samples to test for HIV and syphilis.  You are currently due for your Pap smear and sample was collected for your Pap.  Will follow-up with you with the results of your exam.

## 2024-02-20 NOTE — Progress Notes (Signed)
    SUBJECTIVE:   CHIEF COMPLAINT / HPI:   43 y.o. female presents for routine STD testing.   STI check - preferred gender partner: Male - Medications tried: No medications - Sexually active with 1 msale partner(s) - Last sexual encounter: 3 weeks ago - Contraception: IUD - Symptoms include: Vaginal discharge - No odor - No abd pain  - No fevers or chills   PERTINENT  PMH / PSH: Reviewed   OBJECTIVE:   BP 138/81   Pulse 88   Ht 5\' 6"  (1.676 m)   Wt 200 lb (90.7 kg)   SpO2 100%   BMI 32.28 kg/m    Physical Exam General: Alert, well appearing, NAD Cardiovascular: Regular rate, well-perfused Respiratory: Good work of breathing on RA Abdomen: No distension or tenderness Genitalia:  Normal introitus for age, no external lesions, white scant vaginal discharge with mild odor, mucosa pink and moist, no vaginal or cervical lesions, no vaginal atrophy, no friaility. IUD strand visiualized  CMA Ammon Bales served as chaperone for entire exam.  ASSESSMENT/PLAN:   STD screening Sexually active female, inconsistent with barrier protection presenting for STD testing.  Does endorse vaginal discharge.  Currently have IUD for contraception - Pelvic exam completed today - Ordered lab for GC, Chlamydia, Trichomonas.  - HIV and RPR lab collected  -Counseled on PREP   Cervical Cancer screening Last Pap was in 2020 and normal results consistent with ASCUS.  Due for Pap smear and cervical samples collected for PAP   Goble Last, MD [facility]

## 2024-02-21 ENCOUNTER — Ambulatory Visit: Payer: Self-pay | Admitting: Student

## 2024-02-21 DIAGNOSIS — B9689 Other specified bacterial agents as the cause of diseases classified elsewhere: Secondary | ICD-10-CM

## 2024-02-21 LAB — CERVICOVAGINAL ANCILLARY ONLY
Bacterial Vaginitis (gardnerella): POSITIVE — AB
Candida Glabrata: NEGATIVE
Candida Vaginitis: NEGATIVE
Chlamydia: NEGATIVE
Comment: NEGATIVE
Comment: NEGATIVE
Comment: NEGATIVE
Comment: NEGATIVE
Comment: NEGATIVE
Comment: NORMAL
Neisseria Gonorrhea: NEGATIVE
Trichomonas: NEGATIVE

## 2024-02-21 LAB — RPR: RPR Ser Ql: NONREACTIVE

## 2024-02-21 LAB — HIV ANTIBODY (ROUTINE TESTING W REFLEX): HIV Screen 4th Generation wRfx: NONREACTIVE

## 2024-02-21 MED ORDER — METRONIDAZOLE 500 MG PO TABS: 500.0000 mg | ORAL_TABLET | Freq: Three times a day (TID) | ORAL | 0 refills | Status: DC

## 2024-02-21 NOTE — Telephone Encounter (Signed)
 Called patient to inform her that her test came back positive for BV and sending in metronidazole  500 mg which patient will take 2 times daily for 7 days.  Patient verbalized understanding and agreeable to be sent to her pharmacy.

## 2024-02-24 LAB — CYTOLOGY - PAP
Comment: NEGATIVE
High risk HPV: POSITIVE — AB

## 2024-03-06 NOTE — Telephone Encounter (Signed)
 Called patient to inform her positive HPV and LISL in her post recent PAP. Informed patient giver her last PAP was ASCUS positive will need surveillance colposcopy. Patient verbalized understanding and amendable to plan. Sending staff ,message to Adm team to schedule patient with colposcopy clinic.

## 2024-03-22 ENCOUNTER — Encounter: Payer: Self-pay | Admitting: Student

## 2024-03-22 ENCOUNTER — Ambulatory Visit: Payer: Self-pay

## 2024-08-08 DIAGNOSIS — H6123 Impacted cerumen, bilateral: Secondary | ICD-10-CM | POA: Diagnosis not present

## 2024-08-08 DIAGNOSIS — R062 Wheezing: Secondary | ICD-10-CM | POA: Diagnosis not present

## 2024-08-08 DIAGNOSIS — R030 Elevated blood-pressure reading, without diagnosis of hypertension: Secondary | ICD-10-CM | POA: Diagnosis not present

## 2024-08-08 DIAGNOSIS — R070 Pain in throat: Secondary | ICD-10-CM | POA: Diagnosis not present

## 2024-08-08 DIAGNOSIS — J069 Acute upper respiratory infection, unspecified: Secondary | ICD-10-CM | POA: Diagnosis not present

## 2024-09-10 ENCOUNTER — Encounter: Payer: Self-pay | Admitting: Family Medicine

## 2024-09-10 ENCOUNTER — Ambulatory Visit: Admitting: Family Medicine

## 2024-09-10 VITALS — BP 133/79 | HR 95 | Ht 66.0 in | Wt 194.6 lb

## 2024-09-10 DIAGNOSIS — F319 Bipolar disorder, unspecified: Secondary | ICD-10-CM | POA: Diagnosis not present

## 2024-09-10 DIAGNOSIS — L304 Erythema intertrigo: Secondary | ICD-10-CM

## 2024-09-10 DIAGNOSIS — B354 Tinea corporis: Secondary | ICD-10-CM | POA: Diagnosis not present

## 2024-09-10 MED ORDER — TERBINAFINE HCL 250 MG PO TABS: 250.0000 mg | ORAL_TABLET | Freq: Every day | ORAL | 0 refills | Status: DC

## 2024-09-10 MED ORDER — MUPIROCIN 2 % EX OINT: 1.0000 | TOPICAL_OINTMENT | Freq: Two times a day (BID) | CUTANEOUS | 0 refills | Status: DC

## 2024-09-10 NOTE — Patient Instructions (Addendum)
 1) Your rashes look like they are caused by a fungal irritation.  - Start taking terbinafine  1 tablet once a day for 30 days. Complete the entire 30 days course to make sure the infection is gone.  2) Apply mupirocin  cream to the rash on your belly to help decrease inflammation and decrease drainage. - Once the rash on the stomach scars over, you can stop applying the mupirocin  cream (this should take about 7-14 days) - Apply a layer of vaseline over the rash to protect it.  3) Take a picture of the rashes about once a week to keep an eye on the progress.  4) We will also check your A1c and cholesterol levels. High A1c can be a sign of diabetes.

## 2024-09-10 NOTE — Progress Notes (Signed)
" ° ° °  SUBJECTIVE:   CHIEF COMPLAINT / HPI:   TH is a 43yo F pf rash.   Discussed the use of AI scribe software for clinical note transcription with the patient, who gave verbal consent to proceed.  History of Present Illness Virginia Ballard is a 43 year old female who presents with a persistent rash on her abdomen and other areas.  Cutaneous rash - Persistent rash began on the abdomen in December of last year, initially quarter-sized and circular in shape - Rash has spread to under the breast, right forearm, left elbow, and right side of the neck - Rash is pruritic, with increased itching when exposed to air - Rash is painful with a stinging discomfort, but pain is tolerable due to high pain threshold - Rash sometimes drains, resulting in a crusty nightgown upon waking - Over the past few days, rash has been drying and scabbing, appearing as darker spots - Rash is located where the belt loop rubs, which exacerbates irritation - Rash becomes red and inflamed with emotional upset or frustration - No current use of bandages - Concern about odor from the rash - Photographs taken to monitor progression  Exacerbating and associated factors - Spread to right forearm and neck associated with recent fiberglass exposure - Irritation worsened by friction from belt loop - Pinching and grabbing at the rash due to emotional distress has worsened the condition  Prior treatment - Treated with clobetasol applied twice daily when possible, but found application inconvenient due to schedule - No improvement with clobetasol; rash reached a standstill    PERTINENT  PMH / PSH: bipolar 1, HS, hirsutism  OBJECTIVE:   BP 133/79   Pulse 95   Ht 5' 6 (1.676 m)   Wt 194 lb 9.6 oz (88.3 kg)   SpO2 100%   BMI 31.41 kg/m   General: Alert, pleasant woman. NAD. HEENT: NCAT. MMM. CV: RRR, no murmurs.   Resp: CTAB, no wheezing or crackles. Normal WOB on RA.    Skin:  Round, chronically inflammed,  lichenified, erythematous, and mild honey crusting on R periumbilical area (see below) Erythematous rash w/ sattelite lesions BL inframammary folds R clavicular patch of flaky, thickened skin with mild erythema. No crusting. Similar patch on L elbow.      ASSESSMENT/PLAN:   Assessment & Plan Tinea corporis Intertrigo Circular lesion on abdomen is most c/w tinea corporis. Persistence despite topical steroid also suggests an underlying fungal component. There is some honey crusting, which suggest super imposed impetigo. Lesion on clavicle, elbows also concerning for tinea corporis. Inframammary rashes most cw candida intertrigo. Less likely but also consider eczema, psoriasis (consider biopsy if antigfungals ineffective). Given widespread nature of disease, will treat with systemic antifungal. Discussed checking A1c in case underlying diabetes is increasing her risk of fungal infxn; but pt prefers to wait for follow-up appointment to check. She also wants to wait to check lipid panel and CMP.  - Terbinafine  250mg  daily x30 days course - check A1c, lipid, and CMP at f/u - Take weekly photos of rashes to monitor - If not improving, f/u in 1 month. Consider punch biopsy.    Twyla Nearing, MD [facility] "

## 2024-09-27 ENCOUNTER — Ambulatory Visit: Payer: Self-pay | Admitting: Family Medicine

## 2024-09-27 VITALS — BP 136/75 | HR 100 | Ht 66.0 in | Wt 192.6 lb

## 2024-09-27 DIAGNOSIS — L304 Erythema intertrigo: Secondary | ICD-10-CM | POA: Diagnosis not present

## 2024-09-27 DIAGNOSIS — B354 Tinea corporis: Secondary | ICD-10-CM

## 2024-09-27 LAB — HM MAMMOGRAPHY

## 2024-09-27 MED ORDER — MUPIROCIN 2 % EX OINT: 1.0000 | TOPICAL_OINTMENT | Freq: Two times a day (BID) | CUTANEOUS | 0 refills | Status: AC

## 2024-09-27 NOTE — Progress Notes (Signed)
" ° °  SUBJECTIVE:   CHIEF COMPLAINT / HPI:   Rash follow up - Seen on 12/22 for persistent lesion - Felt to be tinea corporis and was prescribed terbinafine  - Lesions have improved over the body with this medication, now 2 weeks into 4 week course - Continues to use Bactroban  on the area, needs refill  OBJECTIVE:  BP 136/75   Pulse 100   Ht 5' 6 (1.676 m)   Wt 192 lb 9.6 oz (87.4 kg)   SpO2 100%   BMI 31.09 kg/m   General: Alert and oriented, pleasant, in NAD Skin: Warm, dry; improving hyperpigmented and scaly circular lesion of the abdomen; multiple other improving body lesions HEENT: NCAT, EOM grossly normal, midline nasal septum Respiratory: Breathing and speaking comfortably on RA Extremities: Moves all extremities grossly equally Neurological: No gross focal deficit Psychiatric: Appropriate mood and affect   ASSESSMENT/PLAN:   Assessment & Plan Tinea corporis Intertrigo Improving with terbinafine . Continue terbinafine  given tolerance. Mupirocin  refilled. After shared decision making, we will consider biopsy and collect labs at her next visit in 2 weeks given she will have completed her terbinafine  course by then.  Stuart Redo, MD [facility]  "

## 2024-09-27 NOTE — Patient Instructions (Signed)
 I refilled your bactroban  ointment. I am happy to hear the lesions are improving with the medications. Come back in 2 weeks for possible biopsy of the lesion if needed. Let me know if you have any questions in the meantime!

## 2024-10-10 ENCOUNTER — Ambulatory Visit: Payer: Self-pay | Admitting: Family Medicine

## 2024-10-10 ENCOUNTER — Encounter: Payer: Self-pay | Admitting: Family Medicine

## 2024-10-10 VITALS — BP 112/68 | HR 84 | Ht 66.0 in | Wt 196.6 lb

## 2024-10-10 DIAGNOSIS — Z79899 Other long term (current) drug therapy: Secondary | ICD-10-CM

## 2024-10-10 DIAGNOSIS — B354 Tinea corporis: Secondary | ICD-10-CM

## 2024-10-10 DIAGNOSIS — L304 Erythema intertrigo: Secondary | ICD-10-CM

## 2024-10-10 DIAGNOSIS — F319 Bipolar disorder, unspecified: Secondary | ICD-10-CM

## 2024-10-10 DIAGNOSIS — R45851 Suicidal ideations: Secondary | ICD-10-CM

## 2024-10-10 LAB — POCT GLYCOSYLATED HEMOGLOBIN (HGB A1C): Hemoglobin A1C: 5.6 % (ref 4.0–5.6)

## 2024-10-10 MED ORDER — ARIPIPRAZOLE 5 MG PO TABS: 5.0000 mg | ORAL_TABLET | Freq: Once | ORAL | 0 refills | Status: DC

## 2024-10-10 MED ORDER — TERBINAFINE HCL 250 MG PO TABS: 250.0000 mg | ORAL_TABLET | Freq: Every day | ORAL | 0 refills | Status: AC

## 2024-10-10 MED ORDER — FLUOXETINE HCL 20 MG PO TABS: 20.0000 mg | ORAL_TABLET | Freq: Every day | ORAL | 1 refills | Status: AC

## 2024-10-10 NOTE — Patient Instructions (Addendum)
 988 is the suicide hotline, please call if feel you are going to hurt yourself  Safety Planning (Copy and Paste into AVS): Identifying Signs and Triggers:  Things that make thoughts worse How to avoid situations: Leave the interaction with a person who is making you feel worse  Coping Strategies: Things that help patient feel better: Going outside, touching some grass Goals for implementation: Go outside for an hour each day  Distractions: Identify people that can help distract from thoughts: Sister Identify social situations or places that help distract: Doing woodworking and crafts  People to ask for help: List: Sister  Reasons to keep living:  List: Wanting to make a positive difference in life, feels seen and acknowledged and appreciated  Therapy Resources: Please go to:  [facility] [address]  [place], [place] [address] [phone] Urgent psychiatry (medication management) Monday-Thursday 8-11AM.   It is highly recommended that you show up at 7/730 because it is first come first serve. For urgent therapy (not medication) Walk in hours are 8-1pm Monday through Wednesday (please come at 7/730 to ensure you are seen)

## 2024-10-10 NOTE — Assessment & Plan Note (Signed)
 Passive SI.  No plan for self-harm at the moment and no access to firearms with good support structure at home, though feels guilty about burdening sister.  Hypomanic and emotionally labile today, likely in the setting of discontinuing Abilify  and Prozac  approximately 1 year ago.  Willing to restart them now after discussion.  Declines offer for evaluation at [facility]  No indication for immediate IVC. - Restart Abilify  5 mg, Prozac  20 mg - 988 number provided in AVS and discussed indications to call - Emergency precautions discussed - Safety planning in AVS - Close follow-up on Monday as below

## 2024-10-10 NOTE — Progress Notes (Signed)
 "  SUBJECTIVE:   CHIEF COMPLAINT / HPI:  Virginia Ballard is a 44 y.o. female with a pertinent past medical history of HS, bipolar I disorder presenting to the clinic for physical.  Tinea corporis First started in December 2024. Diagnosed with tinea corporis. Has used topical terbinafine  previously. Started on terbinafine  250 mg x30 day course on 09/10/2024. Rash is improving. Patient became distraught when discussing rash, though agrees that it is improving. Requests dermatology referral.  Bipolar I disorder Endorses passive SI today. No plan to do so today. States she would not make a plan, but may do something spontaneous. Endorses past history of cutting and self harm. Has previously been hospitalized for psychiatric reasons. Does not have any guns in the home. States she is impulsive, but not that impulsive to attempt suicide. Denies A/V hallucinations. Denies HI. Will start abilify  and prozac  because I have to do something to keep her job and feel better.  Suicidal ideation Risk Factors:  Other mental health disorders: Bipolar I disorder On/Off Prescribed medications: Not taking Abilify  or Prozac   Prior Attempts: Previous cutting but no suicide attempts Family History: None Access to Lethal Means: No guns or pills at home Social Isolation: Lives with sister, who is a good support structure Co-Morbid Health Conditions: ADHD Delusions/Hallucinations (command hallucinations): None Hx of Substance abuse: Uses marijuana regularly Decreased/Increased Appetite: Increased Decreased/Increased Sleep: Sleeping 5-6 hours nightly Protective Factors:  Family Support: Sister is very supportive Engaged in Outpatient Treatment: No, declines referral Cultural/Religious Connections: None No access to lethal means: None present Assess Ideation:  In the past 2 days, week, months have you had thoughts of killing yourself: Yes How often do thoughts occur: Daily When do they have  thoughts, are they interfering with life: Yes In the past few weeks, have you wished you were dead: Yes In the past few weeks have you felt that you or your family would be better off if you were dead: Yes Are you have thoughts of killing yourself right now: Yes Assess Plan:  Do you have a plan to kill yourself: No If you were going to kill yourself, how would you do it: No idea Assess Behaviors:  Have you ever tried to hurt yourself and how did you do it: Cutting of knife Nature/outcome of past attempts: No prior suicide attempts Self-Injurious Behaviors: Cutting as above Intent:  What are some reasons you would NOT kill your yourself: Want to make a positive impact on the world What are some reasons you WOULD kill yourself: Being tired Do you expect to carry out your plan: No, has no plan   PERTINENT PMH / PSH: Bipolar I disorder Hirsutism Hidradenitis suppurativa  *Remainder reviewed in problem list.   OBJECTIVE:   BP 112/68   Pulse 84   Ht 5' 6 (1.676 m)   Wt 196 lb 9.6 oz (89.2 kg)   SpO2 98%   BMI 31.73 kg/m   General: Age-appropriate, resting in chair in mild distress, NAD, alert and at baseline. Skin: Scaly, erythematous, annular plaques with raised borders with centrifugal growth c/w tinea corporis, most visible on abdomen but greatly improved from prior photo 09/27/24 and 09/10/24. Psych: Pleasant.  Some flight of ideas but interruptible.  Emotionally labile, becomes tearful throughout conversation.  Denies A/V hallucinations.  Endorses passive SI as above, denies HI. Extremities: No peripheral edema bilaterally. Capillary refill <2 seconds.   ASSESSMENT/PLAN:   Assessment & Plan Bipolar I disorder (HCC) Suicidal ideation Passive SI.  No plan  for self-harm at the moment and no access to firearms with good support structure at home, though feels guilty about burdening sister.  Hypomanic and emotionally labile today, likely in the setting of discontinuing Abilify   and Prozac  approximately 1 year ago.  Willing to restart them now after discussion.  Declines offer for evaluation at [facility].  No indication for immediate IVC. - Restart Abilify  5 mg, Prozac  20 mg - 988 number provided in AVS and discussed indications to call - Emergency precautions discussed - Safety planning in AVS - Close follow-up on Monday as below Long term current use of antipsychotic medication Patient not currently taking aripiprazole , however has previously taken it and screening has been completed.  A1c 5.6, unremarkable. - Lipid panel Tinea corporis Intertrigo Rash significantly improved with terbinafine .  Causing patient distress.  She request dermatology referral. - Extend terbinafine  250 mg treatment times additional 30 days - CMP today to monitor transaminases - Ambulatory referral to Derm per patient request  Follow-up scheduled during visit for Monday 1/26 at 10:10 AM with Dr. Tharon for SI follow up.  Virginia Gieselman Toma, MD [facility] "

## 2024-10-11 ENCOUNTER — Ambulatory Visit: Payer: Self-pay | Admitting: Family Medicine

## 2024-10-11 LAB — LIPID PANEL
Chol/HDL Ratio: 2.4 ratio (ref 0.0–4.4)
Cholesterol, Total: 141 mg/dL (ref 100–199)
HDL: 60 mg/dL
LDL Chol Calc (NIH): 72 mg/dL (ref 0–99)
Triglycerides: 35 mg/dL (ref 0–149)
VLDL Cholesterol Cal: 9 mg/dL (ref 5–40)

## 2024-10-11 LAB — COMPREHENSIVE METABOLIC PANEL WITH GFR
ALT: 22 IU/L (ref 0–32)
AST: 20 IU/L (ref 0–40)
Albumin: 4 g/dL (ref 3.9–4.9)
Alkaline Phosphatase: 93 IU/L (ref 41–116)
BUN/Creatinine Ratio: 9 (ref 9–23)
BUN: 7 mg/dL (ref 6–24)
Bilirubin Total: 0.3 mg/dL (ref 0.0–1.2)
CO2: 24 mmol/L (ref 20–29)
Calcium: 9.1 mg/dL (ref 8.7–10.2)
Chloride: 103 mmol/L (ref 96–106)
Creatinine, Ser: 0.79 mg/dL (ref 0.57–1.00)
Globulin, Total: 2.5 g/dL (ref 1.5–4.5)
Glucose: 82 mg/dL (ref 70–99)
Potassium: 4.3 mmol/L (ref 3.5–5.2)
Sodium: 140 mmol/L (ref 134–144)
Total Protein: 6.5 g/dL (ref 6.0–8.5)
eGFR: 95 mL/min/1.73

## 2024-10-15 ENCOUNTER — Ambulatory Visit (INDEPENDENT_AMBULATORY_CARE_PROVIDER_SITE_OTHER): Payer: Self-pay | Admitting: Family Medicine

## 2024-10-15 DIAGNOSIS — F319 Bipolar disorder, unspecified: Secondary | ICD-10-CM | POA: Diagnosis not present

## 2024-10-15 DIAGNOSIS — B354 Tinea corporis: Secondary | ICD-10-CM | POA: Diagnosis not present

## 2024-10-15 NOTE — Progress Notes (Addendum)
 [facility] Telemedicine Visit  Patient consented to have virtual visit and was identified by name and date of birth. 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 Provider: Stuart Redo - located at home given winter storm  Chief Complaint: follow up mood, tinea  HPI:  Bipolar I disorder - Has been working since last visit a lot - Not able to pick up Abilify  or Prozac  since last visit due to work schedule/winter storm - Hopes to pick up medicines today - Mood stable, work has been distracting - No SI currently - Still has [facility] and 988 resources if in acute crisis - Plans to follow up soon pending work scheduling for annual physical - Recent A1c and lipid panel normal in setting of future Abilify  use  Tinea corporis - Continues to improve with terbinafine  - Recent LFTs normal  ROS: per HPI  Pertinent PMHx: ADHD, adjustment disorder, depression  Exam:  Respiratory: Breathing and speaking comfortably with me over the phone Psychiatric: Pleasant, normal speech and thought content  Assessment/Plan:  Bipolar I disorder (HCC) Stable, no SI today. She hopes to obtain her Abilify  and Prozac  either today or tomorrow. She has resources to [facility] and the 988 number if in acute crisis. She will schedule close follow up for annual physical soon and let us  know if any side effects to medications occur before then.   Tinea corporis of the abdomen Continue current treatment given reassuring labs and improvement. Recently extended to additional 30 day course. Dermatology referral placed at last visit.  Time spent during visit with patient: 6 minutes

## 2024-10-15 NOTE — Patient Instructions (Signed)
 Keep up the excellent work. Let us  know if you have any issues obtaining or taking your Abilify  or Prozac . Be sure to schedule your annual physical soon to follow up on mood, the skin, and your overall health.

## 2024-10-15 NOTE — Assessment & Plan Note (Signed)
 Stable, no SI today. She hopes to obtain her Abilify  and Prozac  either today or tomorrow. She has resources to [facility] and the 988 number if in acute crisis. She will schedule close follow up for annual physical soon and let us  know if any side effects to medications occur before then.

## 2024-10-16 ENCOUNTER — Telehealth: Payer: Self-pay

## 2024-10-16 DIAGNOSIS — F319 Bipolar disorder, unspecified: Secondary | ICD-10-CM

## 2024-10-16 MED ORDER — ARIPIPRAZOLE 5 MG PO TABS: 5.0000 mg | ORAL_TABLET | Freq: Every day | ORAL | 2 refills | Status: AC

## 2024-10-16 NOTE — Telephone Encounter (Signed)
 Received call from pharmacy regarding Abilify  prescription.   Directions indicate to take 1 tablet by mouth once.   They will need updated rx in order to fill rx.   Chiquita JAYSON English, RN

## 2024-10-16 NOTE — Telephone Encounter (Signed)
 Changed to one pill daily.

## 2024-10-17 NOTE — Progress Notes (Unsigned)
" ° ° °  SUBJECTIVE:   Chief compliant/HPI: annual examination  MAILYN STEICHEN is a 44 y.o. who presents today for an annual exam.   History tabs reviewed and updated ***.   Review of systems form reviewed and notable for ***.   OBJECTIVE:   There were no vitals taken for this visit.  ***  ASSESSMENT/PLAN:   Assessment & Plan    Annual Examination See AVS for recommendations. PHQ score ***, reviewed. Blood pressure value is *** goal. Advanced directives ***  Considered the following screening exams based upon USPSTF recommendations: Diabetes screening: {FMCANNUALORDERED:33692} HIV testing:{FMCANNUALORDERED:33692} Hepatitis C: {FMCANNUALORDERED:33692} Hepatitis B:{FMCANNUALORDERED:33692} Syphilis if at high risk: {FMCANNUALORDERED:33692} GC/CT {GC/CT screening :23818} Lipid panel (nonfasting or fasting) discussed based upon AHA recommendations and {FMCLIPID:33694}.  Consider repeat every 4-6 years. Reviewed risk factors for latent tuberculosis and {not indicated/requested/declined:14582}.  Cancer Screening Discussion Colorectal cancer screening: {crcscreen:23821::discussed, colonoscopy ordered} if age 58 or older. Vaccinations ***.   Follow up in 1 *** year or sooner if indicated. MyChart Activation: {MYCHARTLIST:32522}   Camran Keady Toma, MD [facility] "

## 2024-10-18 ENCOUNTER — Encounter: Payer: Self-pay | Admitting: Family Medicine

## 2024-10-19 ENCOUNTER — Encounter: Admitting: Family Medicine

## 2024-10-19 NOTE — Progress Notes (Unsigned)
" ° ° °  SUBJECTIVE:   Chief compliant/HPI: annual examination  MAILYN STEICHEN is a 44 y.o. who presents today for an annual exam.   History tabs reviewed and updated ***.   Review of systems form reviewed and notable for ***.   OBJECTIVE:   There were no vitals taken for this visit.  ***  ASSESSMENT/PLAN:   Assessment & Plan    Annual Examination See AVS for recommendations. PHQ score ***, reviewed. Blood pressure value is *** goal. Advanced directives ***  Considered the following screening exams based upon USPSTF recommendations: Diabetes screening: {FMCANNUALORDERED:33692} HIV testing:{FMCANNUALORDERED:33692} Hepatitis C: {FMCANNUALORDERED:33692} Hepatitis B:{FMCANNUALORDERED:33692} Syphilis if at high risk: {FMCANNUALORDERED:33692} GC/CT {GC/CT screening :23818} Lipid panel (nonfasting or fasting) discussed based upon AHA recommendations and {FMCLIPID:33694}.  Consider repeat every 4-6 years. Reviewed risk factors for latent tuberculosis and {not indicated/requested/declined:14582}.  Cancer Screening Discussion Colorectal cancer screening: {crcscreen:23821::discussed, colonoscopy ordered} if age 58 or older. Vaccinations ***.   Follow up in 1 *** year or sooner if indicated. MyChart Activation: {MYCHARTLIST:32522}   Camran Keady Toma, MD [facility] "

## 2024-11-06 ENCOUNTER — Encounter: Payer: Self-pay | Admitting: Family Medicine
